# Patient Record
Sex: Female | Born: 1960
Health system: Southern US, Community
[De-identification: ages and names within clinical notes are randomized; demographics above are authoritative.]

## PROBLEM LIST (undated history)

## (undated) DIAGNOSIS — R0600 Dyspnea, unspecified: Secondary | ICD-10-CM

## (undated) DIAGNOSIS — N281 Cyst of kidney, acquired: Secondary | ICD-10-CM

## (undated) DIAGNOSIS — R748 Abnormal levels of other serum enzymes: Secondary | ICD-10-CM

## (undated) DIAGNOSIS — T4145XA Adverse effect of unspecified anesthetic, initial encounter: Secondary | ICD-10-CM

## (undated) DIAGNOSIS — Z9889 Other specified postprocedural states: Secondary | ICD-10-CM

## (undated) DIAGNOSIS — E119 Type 2 diabetes mellitus without complications: Secondary | ICD-10-CM

## (undated) DIAGNOSIS — M1990 Unspecified osteoarthritis, unspecified site: Secondary | ICD-10-CM

## (undated) DIAGNOSIS — J111 Influenza due to unidentified influenza virus with other respiratory manifestations: Secondary | ICD-10-CM

## (undated) DIAGNOSIS — E559 Vitamin D deficiency, unspecified: Secondary | ICD-10-CM

## (undated) DIAGNOSIS — T8859XA Other complications of anesthesia, initial encounter: Secondary | ICD-10-CM

## (undated) DIAGNOSIS — S4290XA Fracture of unspecified shoulder girdle, part unspecified, initial encounter for closed fracture: Secondary | ICD-10-CM

## (undated) DIAGNOSIS — R112 Nausea with vomiting, unspecified: Secondary | ICD-10-CM

## (undated) DIAGNOSIS — Z87442 Personal history of urinary calculi: Secondary | ICD-10-CM

## (undated) DIAGNOSIS — J45909 Unspecified asthma, uncomplicated: Secondary | ICD-10-CM

## (undated) DIAGNOSIS — I10 Essential (primary) hypertension: Secondary | ICD-10-CM

## (undated) DIAGNOSIS — D649 Anemia, unspecified: Secondary | ICD-10-CM

---
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
## 1982-07-09 HISTORY — PX: NASAL SEPTUM SURGERY: SHX37

## 1999-09-26 ENCOUNTER — Encounter: Payer: Self-pay | Admitting: Internal Medicine

## 1999-09-26 ENCOUNTER — Encounter: Admission: RE | Admit: 1999-09-26 | Discharge: 1999-09-26 | Payer: Self-pay | Admitting: Internal Medicine

## 1999-10-02 ENCOUNTER — Encounter: Admission: RE | Admit: 1999-10-02 | Discharge: 1999-10-02 | Payer: Self-pay | Admitting: Internal Medicine

## 1999-10-02 ENCOUNTER — Encounter: Payer: Self-pay | Admitting: Internal Medicine

## 2000-01-15 ENCOUNTER — Emergency Department (HOSPITAL_COMMUNITY): Admission: EM | Admit: 2000-01-15 | Discharge: 2000-01-15 | Payer: Self-pay | Admitting: Emergency Medicine

## 2000-01-16 ENCOUNTER — Encounter: Payer: Self-pay | Admitting: Emergency Medicine

## 2001-10-31 ENCOUNTER — Encounter: Payer: Self-pay | Admitting: Internal Medicine

## 2001-10-31 ENCOUNTER — Encounter: Admission: RE | Admit: 2001-10-31 | Discharge: 2001-10-31 | Payer: Self-pay | Admitting: Internal Medicine

## 2002-03-02 ENCOUNTER — Encounter: Admission: RE | Admit: 2002-03-02 | Discharge: 2002-03-02 | Payer: Self-pay | Admitting: Internal Medicine

## 2002-03-02 ENCOUNTER — Encounter: Payer: Self-pay | Admitting: Internal Medicine

## 2002-05-04 ENCOUNTER — Encounter: Payer: Self-pay | Admitting: Internal Medicine

## 2002-05-04 ENCOUNTER — Encounter: Admission: RE | Admit: 2002-05-04 | Discharge: 2002-05-04 | Payer: Self-pay | Admitting: Internal Medicine

## 2002-07-22 ENCOUNTER — Encounter: Admission: RE | Admit: 2002-07-22 | Discharge: 2002-07-22 | Payer: Self-pay | Admitting: Internal Medicine

## 2002-07-22 ENCOUNTER — Encounter: Payer: Self-pay | Admitting: Internal Medicine

## 2003-02-04 ENCOUNTER — Encounter: Payer: Self-pay | Admitting: Internal Medicine

## 2003-02-04 ENCOUNTER — Encounter: Admission: RE | Admit: 2003-02-04 | Discharge: 2003-02-04 | Payer: Self-pay | Admitting: Internal Medicine

## 2003-12-08 ENCOUNTER — Encounter: Admission: RE | Admit: 2003-12-08 | Discharge: 2003-12-08 | Payer: Self-pay | Admitting: Internal Medicine

## 2004-05-19 ENCOUNTER — Encounter: Admission: RE | Admit: 2004-05-19 | Discharge: 2004-05-19 | Payer: Self-pay | Admitting: Internal Medicine

## 2005-04-13 ENCOUNTER — Encounter: Admission: RE | Admit: 2005-04-13 | Discharge: 2005-04-13 | Payer: Self-pay | Admitting: Internal Medicine

## 2005-04-13 ENCOUNTER — Other Ambulatory Visit: Admission: RE | Admit: 2005-04-13 | Discharge: 2005-04-13 | Payer: Self-pay | Admitting: Internal Medicine

## 2006-04-02 ENCOUNTER — Encounter: Admission: RE | Admit: 2006-04-02 | Discharge: 2006-04-02 | Payer: Self-pay | Admitting: Internal Medicine

## 2007-06-09 ENCOUNTER — Encounter: Admission: RE | Admit: 2007-06-09 | Discharge: 2007-06-09 | Payer: Self-pay | Admitting: Internal Medicine

## 2008-02-23 ENCOUNTER — Encounter: Admission: RE | Admit: 2008-02-23 | Discharge: 2008-02-23 | Payer: Self-pay | Admitting: Internal Medicine

## 2008-05-19 ENCOUNTER — Other Ambulatory Visit: Admission: RE | Admit: 2008-05-19 | Discharge: 2008-05-19 | Payer: Self-pay | Admitting: Internal Medicine

## 2008-10-28 ENCOUNTER — Encounter: Admission: RE | Admit: 2008-10-28 | Discharge: 2008-10-28 | Payer: Self-pay | Admitting: Internal Medicine

## 2008-11-05 ENCOUNTER — Encounter: Admission: RE | Admit: 2008-11-05 | Discharge: 2008-11-05 | Payer: Self-pay | Admitting: Internal Medicine

## 2009-11-16 ENCOUNTER — Ambulatory Visit (HOSPITAL_COMMUNITY): Admission: RE | Admit: 2009-11-16 | Discharge: 2009-11-16 | Payer: Self-pay | Admitting: Orthopedic Surgery

## 2009-11-16 ENCOUNTER — Encounter (INDEPENDENT_AMBULATORY_CARE_PROVIDER_SITE_OTHER): Payer: Self-pay | Admitting: Orthopedic Surgery

## 2009-11-16 ENCOUNTER — Ambulatory Visit: Payer: Self-pay | Admitting: Vascular Surgery

## 2010-01-06 HISTORY — PX: KNEE ARTHROSCOPY: SHX127

## 2010-01-24 ENCOUNTER — Ambulatory Visit (HOSPITAL_COMMUNITY): Admission: RE | Admit: 2010-01-24 | Discharge: 2010-01-24 | Payer: Self-pay | Admitting: Orthopedic Surgery

## 2010-01-31 ENCOUNTER — Encounter: Admission: RE | Admit: 2010-01-31 | Discharge: 2010-01-31 | Payer: Self-pay | Admitting: Orthopedic Surgery

## 2010-06-30 ENCOUNTER — Encounter
Admission: RE | Admit: 2010-06-30 | Discharge: 2010-06-30 | Payer: Self-pay | Source: Home / Self Care | Attending: Internal Medicine | Admitting: Internal Medicine

## 2010-09-24 LAB — COMPREHENSIVE METABOLIC PANEL
ALT: 30 U/L (ref 0–35)
AST: 28 U/L (ref 0–37)
Albumin: 4.1 g/dL (ref 3.5–5.2)
Alkaline Phosphatase: 115 U/L (ref 39–117)
BUN: 17 mg/dL (ref 6–23)
CO2: 27 mEq/L (ref 19–32)
Calcium: 9.5 mg/dL (ref 8.4–10.5)
Chloride: 102 mEq/L (ref 96–112)
Creatinine, Ser: 0.75 mg/dL (ref 0.4–1.2)
GFR calc Af Amer: >60 mL/min (ref >60)
GFR calc non Af Amer: >60 mL/min (ref >60)
Glucose, Bld: 112 mg/dL — ABNORMAL HIGH (ref 70–99)
Potassium: 3.8 mEq/L (ref 3.5–5.1)
Sodium: 137 mEq/L (ref 135–145)
Total Bilirubin: 0.8 mg/dL (ref 0.3–1.2)
Total Protein: 7.9 g/dL (ref 6.0–8.3)

## 2010-09-24 LAB — URINALYSIS, ROUTINE W REFLEX MICROSCOPIC
Bilirubin Urine: NEGATIVE
Glucose, UA: NEGATIVE mg/dL
Hgb urine dipstick: NEGATIVE
Ketones, ur: NEGATIVE mg/dL
Nitrite: NEGATIVE
Protein, ur: NEGATIVE mg/dL
Specific Gravity, Urine: 1.019 (ref 1.005–1.030)
Urobilinogen, UA: 0.2 mg/dL (ref 0.0–1.0)
pH: 5.5 (ref 5.0–8.0)

## 2010-09-24 LAB — DIFFERENTIAL
Basophils Absolute: 0 10*3/uL (ref 0.0–0.1)
Basophils Relative: 1 % (ref 0–1)
Eosinophils Absolute: 0.2 10*3/uL (ref 0.0–0.7)
Eosinophils Relative: 3 % (ref 0–5)
Lymphocytes Relative: 35 % (ref 12–46)
Lymphs Abs: 2.6 10*3/uL (ref 0.7–4.0)
Monocytes Absolute: 0.7 10*3/uL (ref 0.1–1.0)
Monocytes Relative: 9 % (ref 3–12)
Neutro Abs: 4 10*3/uL (ref 1.7–7.7)
Neutrophils Relative %: 53 % (ref 43–77)

## 2010-09-24 LAB — CBC
HCT: 37.2 % (ref 36.0–46.0)
Hemoglobin: 12.5 g/dL (ref 12.0–15.0)
MCH: 28.9 pg (ref 26.0–34.0)
MCHC: 33.7 g/dL (ref 30.0–36.0)
MCV: 85.6 fL (ref 78.0–100.0)
Platelets: 287 10*3/uL (ref 150–400)
RBC: 4.34 MIL/uL (ref 3.87–5.11)
RDW: 14.3 % (ref 11.5–15.5)
WBC: 7.5 10*3/uL (ref 4.0–10.5)

## 2010-09-24 LAB — SURGICAL PCR SCREEN: MRSA, PCR: INVALID — AB

## 2010-09-24 LAB — PROTIME-INR
INR: 0.98 (ref 0.00–1.49)
Prothrombin Time: 12.9 seconds (ref 11.6–15.2)

## 2010-12-27 ENCOUNTER — Other Ambulatory Visit: Payer: Self-pay | Admitting: Internal Medicine

## 2010-12-27 ENCOUNTER — Other Ambulatory Visit (HOSPITAL_COMMUNITY)
Admission: RE | Admit: 2010-12-27 | Discharge: 2010-12-27 | Disposition: A | Discharge status: Home / Self Care | Payer: BC Managed Care – PPO | Source: Ambulatory Visit | Attending: Internal Medicine | Admitting: Internal Medicine

## 2010-12-27 DIAGNOSIS — Z01419 Encounter for gynecological examination (general) (routine) without abnormal findings: Secondary | ICD-10-CM | POA: Insufficient documentation

## 2010-12-27 DIAGNOSIS — Z1159 Encounter for screening for other viral diseases: Secondary | ICD-10-CM | POA: Insufficient documentation

## 2011-03-02 ENCOUNTER — Other Ambulatory Visit: Payer: Self-pay | Admitting: Otolaryngology

## 2011-03-02 DIAGNOSIS — K115 Sialolithiasis: Secondary | ICD-10-CM

## 2011-03-02 DIAGNOSIS — R599 Enlarged lymph nodes, unspecified: Secondary | ICD-10-CM

## 2011-03-06 ENCOUNTER — Ambulatory Visit
Admission: RE | Admit: 2011-03-06 | Discharge: 2011-03-06 | Disposition: A | Discharge status: Home / Self Care | Payer: BC Managed Care – PPO | Source: Ambulatory Visit | Attending: Otolaryngology | Admitting: Otolaryngology

## 2011-03-06 DIAGNOSIS — K115 Sialolithiasis: Secondary | ICD-10-CM

## 2011-03-06 DIAGNOSIS — R599 Enlarged lymph nodes, unspecified: Secondary | ICD-10-CM

## 2011-03-06 MED ORDER — IOHEXOL 300 MG/ML  SOLN
75.0000 mL | Freq: Once | INTRAMUSCULAR | Status: AC | PRN
  Administered 2011-03-06: 75 mL via INTRAVENOUS

## 2011-08-08 ENCOUNTER — Other Ambulatory Visit: Payer: Self-pay | Admitting: Internal Medicine

## 2011-08-08 DIAGNOSIS — Z1231 Encounter for screening mammogram for malignant neoplasm of breast: Secondary | ICD-10-CM

## 2011-08-09 ENCOUNTER — Ambulatory Visit
Admission: RE | Admit: 2011-08-09 | Discharge: 2011-08-09 | Disposition: A | Discharge status: Home / Self Care | Payer: BC Managed Care – PPO | Source: Ambulatory Visit | Attending: Allergy | Admitting: Allergy

## 2011-08-09 ENCOUNTER — Ambulatory Visit
Admission: RE | Admit: 2011-08-09 | Discharge: 2011-08-09 | Disposition: A | Discharge status: Home / Self Care | Payer: BC Managed Care – PPO | Source: Ambulatory Visit | Attending: Internal Medicine | Admitting: Internal Medicine

## 2011-08-09 ENCOUNTER — Other Ambulatory Visit: Payer: Self-pay | Admitting: Allergy

## 2011-08-09 DIAGNOSIS — J45909 Unspecified asthma, uncomplicated: Secondary | ICD-10-CM

## 2011-08-09 DIAGNOSIS — Z1231 Encounter for screening mammogram for malignant neoplasm of breast: Secondary | ICD-10-CM

## 2011-08-10 ENCOUNTER — Other Ambulatory Visit: Payer: Self-pay | Admitting: Internal Medicine

## 2011-08-10 DIAGNOSIS — R928 Other abnormal and inconclusive findings on diagnostic imaging of breast: Secondary | ICD-10-CM

## 2011-08-23 ENCOUNTER — Ambulatory Visit
Admission: RE | Admit: 2011-08-23 | Discharge: 2011-08-23 | Disposition: A | Discharge status: Home / Self Care | Payer: BC Managed Care – PPO | Source: Ambulatory Visit | Attending: Internal Medicine | Admitting: Internal Medicine

## 2011-08-23 ENCOUNTER — Other Ambulatory Visit: Payer: BC Managed Care – PPO

## 2011-08-23 DIAGNOSIS — R928 Other abnormal and inconclusive findings on diagnostic imaging of breast: Secondary | ICD-10-CM

## 2012-01-30 ENCOUNTER — Other Ambulatory Visit: Payer: Self-pay | Admitting: Internal Medicine

## 2012-02-05 ENCOUNTER — Ambulatory Visit
Admission: RE | Admit: 2012-02-05 | Discharge: 2012-02-05 | Disposition: A | Discharge status: Home / Self Care | Payer: BC Managed Care – PPO | Source: Ambulatory Visit | Attending: Internal Medicine | Admitting: Internal Medicine

## 2012-12-26 NOTE — Patient Instructions (Addendum)
Samantha Park  6/Samantha/2014   Your procedure is scheduled on: 12/31/12  Report to [facility] at 9:30 AM.  Call this number if you have problems the morning of surgery [phone]   Remember:   Do not eat food or drink liquids After Midnight.    Do not wear jewelry, make-up or nail polish.  Do not wear lotions, powders, or perfumes. You may wear deodorant.  Do not shave 48 hours prior to surgery. Men may shave face and neck.  Do not bring valuables to the hospital.  Contacts, dentures or bridgework may not be worn into surgery.   Patients discharged the day of surgery will not be allowed to drive home.  Name and phone number of your driver: Samantha Park (husband) [phone]     Please read over the following fact sheets that you were given: MRSA Information.  Birdie Sons, RN  pre op nurse call if needed [phone]    FAILURE TO FOLLOW THESE INSTRUCTIONS MAY RESULT IN CANCELLATION OF YOUR SURGERY   Patient Signature: ___________________________________________

## 2012-12-29 ENCOUNTER — Encounter (HOSPITAL_COMMUNITY)
Admission: RE | Admit: 2012-12-29 | Discharge: 2012-12-29 | Disposition: A | Discharge status: Home / Self Care | Payer: BC Managed Care – PPO | Source: Ambulatory Visit | Attending: Orthopedic Surgery | Admitting: Orthopedic Surgery

## 2012-12-29 ENCOUNTER — Encounter (HOSPITAL_COMMUNITY): Payer: Self-pay | Admitting: Pharmacy Technician

## 2012-12-29 ENCOUNTER — Ambulatory Visit (HOSPITAL_COMMUNITY)
Admission: RE | Admit: 2012-12-29 | Discharge: 2012-12-29 | Disposition: A | Discharge status: Home / Self Care | Payer: BC Managed Care – PPO | Source: Ambulatory Visit | Attending: Orthopedic Surgery | Admitting: Orthopedic Surgery

## 2012-12-29 ENCOUNTER — Encounter (HOSPITAL_COMMUNITY): Payer: Self-pay

## 2012-12-29 DIAGNOSIS — Z01818 Encounter for other preprocedural examination: Secondary | ICD-10-CM | POA: Insufficient documentation

## 2012-12-29 DIAGNOSIS — Z01810 Encounter for preprocedural cardiovascular examination: Secondary | ICD-10-CM | POA: Insufficient documentation

## 2012-12-29 HISTORY — DX: Unspecified asthma, uncomplicated: J45.909

## 2012-12-29 HISTORY — DX: Anemia, unspecified: D64.9

## 2012-12-29 HISTORY — DX: Vitamin D deficiency, unspecified: E55.9

## 2012-12-29 HISTORY — DX: Essential (primary) hypertension: I10

## 2012-12-29 HISTORY — DX: Unspecified osteoarthritis, unspecified site: M19.90

## 2012-12-29 LAB — CBC
HCT: 35.7 % — ABNORMAL LOW (ref 36.0–46.0)
Hemoglobin: 11.7 g/dL — ABNORMAL LOW (ref 12.0–15.0)
MCH: 28.3 pg (ref 26.0–34.0)
MCV: 86.2 fL (ref 78.0–100.0)
RBC: 4.14 MIL/uL (ref 3.87–5.11)

## 2012-12-29 LAB — BASIC METABOLIC PANEL
CO2: 29 mEq/L (ref 19–32)
Calcium: 9.5 mg/dL (ref 8.4–10.5)
Creatinine, Ser: 0.77 mg/dL (ref 0.50–1.10)
Glucose, Bld: 97 mg/dL (ref 70–99)

## 2012-12-29 LAB — SURGICAL PCR SCREEN: Staphylococcus aureus: NEGATIVE

## 2012-12-29 NOTE — Progress Notes (Signed)
12/29/12 0921  OBSTRUCTIVE SLEEP APNEA  Have you ever been diagnosed with sleep apnea through a sleep study? No  Do you snore loudly (loud enough to be heard through closed doors)?  1  Do you often feel tired, fatigued, or sleepy during the daytime? 0  Has anyone observed you stop breathing during your sleep? 0  Do you have, or are you being treated for high blood pressure? 1  BMI more than 35 kg/m2? 1  Age over 52 years old? 1  Neck circumference greater than 40 cm/18 inches? 0  Gender: 0  Obstructive Sleep Apnea Score 4  Score 4 or greater  Results sent to PCP

## 2012-12-31 ENCOUNTER — Encounter (HOSPITAL_COMMUNITY): Payer: Self-pay | Admitting: *Deleted

## 2012-12-31 ENCOUNTER — Ambulatory Visit (HOSPITAL_COMMUNITY): Payer: BC Managed Care – PPO | Admitting: Anesthesiology

## 2012-12-31 ENCOUNTER — Encounter (HOSPITAL_COMMUNITY)
Admission: RE | Disposition: A | Discharge status: Home / Self Care | Payer: Self-pay | Source: Ambulatory Visit | Attending: Orthopedic Surgery

## 2012-12-31 ENCOUNTER — Encounter (HOSPITAL_COMMUNITY): Payer: Self-pay | Admitting: Anesthesiology

## 2012-12-31 ENCOUNTER — Ambulatory Visit (HOSPITAL_COMMUNITY)
Admission: RE | Admit: 2012-12-31 | Discharge: 2012-12-31 | Disposition: A | Discharge status: Home / Self Care | Payer: BC Managed Care – PPO | Source: Ambulatory Visit | Attending: Orthopedic Surgery | Admitting: Orthopedic Surgery

## 2012-12-31 DIAGNOSIS — M6580 Other synovitis and tenosynovitis, unspecified site: Secondary | ICD-10-CM | POA: Insufficient documentation

## 2012-12-31 DIAGNOSIS — Z79899 Other long term (current) drug therapy: Secondary | ICD-10-CM | POA: Insufficient documentation

## 2012-12-31 DIAGNOSIS — X58XXXA Exposure to other specified factors, initial encounter: Secondary | ICD-10-CM | POA: Insufficient documentation

## 2012-12-31 DIAGNOSIS — M9420 Chondromalacia, unspecified site: Secondary | ICD-10-CM | POA: Insufficient documentation

## 2012-12-31 DIAGNOSIS — IMO0002 Reserved for concepts with insufficient information to code with codable children: Secondary | ICD-10-CM | POA: Insufficient documentation

## 2012-12-31 DIAGNOSIS — M1710 Unilateral primary osteoarthritis, unspecified knee: Secondary | ICD-10-CM | POA: Insufficient documentation

## 2012-12-31 DIAGNOSIS — M23321 Other meniscus derangements, posterior horn of medial meniscus, right knee: Secondary | ICD-10-CM

## 2012-12-31 DIAGNOSIS — M23329 Other meniscus derangements, posterior horn of medial meniscus, unspecified knee: Secondary | ICD-10-CM | POA: Diagnosis present

## 2012-12-31 DIAGNOSIS — J45909 Unspecified asthma, uncomplicated: Secondary | ICD-10-CM | POA: Insufficient documentation

## 2012-12-31 DIAGNOSIS — I10 Essential (primary) hypertension: Secondary | ICD-10-CM | POA: Insufficient documentation

## 2012-12-31 HISTORY — PX: KNEE ARTHROSCOPY WITH MEDIAL MENISECTOMY: SHX5651

## 2012-12-31 SURGERY — ARTHROSCOPY, KNEE, WITH MEDIAL MENISCECTOMY
Anesthesia: General | Site: Knee | Laterality: Right | Wound class: Clean

## 2012-12-31 MED ORDER — HYDROMORPHONE HCL 2 MG PO TABS: 2.0000 mg | ORAL_TABLET | ORAL | Status: DC | PRN

## 2012-12-31 MED ORDER — HYDROMORPHONE HCL PF 1 MG/ML IJ SOLN
INTRAMUSCULAR | Status: AC
  Filled 2012-12-31: qty 1

## 2012-12-31 MED ORDER — ONDANSETRON HCL 4 MG/2ML IJ SOLN
INTRAMUSCULAR | Status: DC | PRN
  Administered 2012-12-31: 4 mg via INTRAVENOUS

## 2012-12-31 MED ORDER — LACTATED RINGERS IR SOLN
Status: DC | PRN
  Administered 2012-12-31: 6000 mL

## 2012-12-31 MED ORDER — BUPIVACAINE-EPINEPHRINE PF 0.25-1:200000 % IJ SOLN
INTRAMUSCULAR | Status: AC
  Filled 2012-12-31: qty 30

## 2012-12-31 MED ORDER — BUPIVACAINE-EPINEPHRINE 0.25% -1:200000 IJ SOLN
INTRAMUSCULAR | Status: DC | PRN
  Administered 2012-12-31: 30 mL

## 2012-12-31 MED ORDER — LACTATED RINGERS IV SOLN
INTRAVENOUS | Status: DC
  Administered 2012-12-31: 1000 mL via INTRAVENOUS

## 2012-12-31 MED ORDER — PROPOFOL 10 MG/ML IV BOLUS
INTRAVENOUS | Status: DC | PRN
  Administered 2012-12-31: 180 mg via INTRAVENOUS

## 2012-12-31 MED ORDER — HYDROMORPHONE HCL PF 1 MG/ML IJ SOLN
0.2500 mg | INTRAMUSCULAR | Status: DC | PRN
  Administered 2012-12-31 (×2): 0.5 mg via INTRAVENOUS

## 2012-12-31 MED ORDER — MIDAZOLAM HCL 2 MG/2ML IJ SOLN
INTRAMUSCULAR | Status: AC
  Administered 2012-12-31: 1 mg
  Filled 2012-12-31: qty 2

## 2012-12-31 MED ORDER — PROMETHAZINE HCL 25 MG/ML IJ SOLN
INTRAMUSCULAR | Status: AC
  Filled 2012-12-31: qty 1

## 2012-12-31 MED ORDER — BACITRACIN ZINC 500 UNIT/GM EX OINT
TOPICAL_OINTMENT | CUTANEOUS | Status: DC | PRN
  Administered 2012-12-31: 1 via TOPICAL

## 2012-12-31 MED ORDER — FENTANYL CITRATE 0.05 MG/ML IJ SOLN
INTRAMUSCULAR | Status: DC | PRN
  Administered 2012-12-31 (×3): 25 ug via INTRAVENOUS

## 2012-12-31 MED ORDER — PROMETHAZINE HCL 25 MG/ML IJ SOLN
6.2500 mg | INTRAMUSCULAR | Status: DC | PRN
  Administered 2012-12-31: 6.25 mg via INTRAVENOUS

## 2012-12-31 MED ORDER — MIDAZOLAM HCL 5 MG/5ML IJ SOLN
INTRAMUSCULAR | Status: DC | PRN
  Administered 2012-12-31: 2 mg via INTRAVENOUS

## 2012-12-31 MED ORDER — CEFAZOLIN SODIUM-DEXTROSE 2-3 GM-% IV SOLR
2.0000 g | INTRAVENOUS | Status: AC
  Administered 2012-12-31: 2 g via INTRAVENOUS

## 2012-12-31 MED ORDER — HYDROMORPHONE HCL PF 1 MG/ML IJ SOLN
INTRAMUSCULAR | Status: DC | PRN
  Administered 2012-12-31 (×2): 1 mg via INTRAVENOUS

## 2012-12-31 MED ORDER — BACITRACIN ZINC 500 UNIT/GM EX OINT
TOPICAL_OINTMENT | CUTANEOUS | Status: AC
  Filled 2012-12-31: qty 15

## 2012-12-31 MED ORDER — CEFAZOLIN SODIUM-DEXTROSE 2-3 GM-% IV SOLR
INTRAVENOUS | Status: AC
  Filled 2012-12-31: qty 50

## 2012-12-31 MED ORDER — LIDOCAINE HCL (CARDIAC) 20 MG/ML IV SOLN
INTRAVENOUS | Status: DC | PRN
  Administered 2012-12-31: 50 mg via INTRAVENOUS

## 2012-12-31 SURGICAL SUPPLY — 30 items
BANDAGE ELASTIC 4 VELCRO ST LF (GAUZE/BANDAGES/DRESSINGS) ×2 IMPLANT
BANDAGE ELASTIC 6 VELCRO ST LF (GAUZE/BANDAGES/DRESSINGS) ×2 IMPLANT
BANDAGE GAUZE ELAST BULKY 4 IN (GAUZE/BANDAGES/DRESSINGS) ×2 IMPLANT
BLADE GREAT WHITE 4.2 (BLADE) ×2 IMPLANT
BNDG COHESIVE 6X5 TAN STRL LF (GAUZE/BANDAGES/DRESSINGS) ×2 IMPLANT
CLOTH BEACON ORANGE TIMEOUT ST (SAFETY) ×2 IMPLANT
DRAPE LG THREE QUARTER DISP (DRAPES) ×2 IMPLANT
DRSG EMULSION OIL 3X3 NADH (GAUZE/BANDAGES/DRESSINGS) ×2 IMPLANT
DRSG PAD ABDOMINAL 8X10 ST (GAUZE/BANDAGES/DRESSINGS) ×2 IMPLANT
DURAPREP 26ML APPLICATOR (WOUND CARE) ×2 IMPLANT
GLOVE BIOGEL PI IND STRL 6.5 (GLOVE) ×1 IMPLANT
GLOVE BIOGEL PI IND STRL 8 (GLOVE) ×1 IMPLANT
GLOVE BIOGEL PI IND STRL 8.5 (GLOVE) ×1 IMPLANT
GLOVE BIOGEL PI INDICATOR 6.5 (GLOVE) ×1
GLOVE BIOGEL PI INDICATOR 8 (GLOVE) ×1
GLOVE BIOGEL PI INDICATOR 8.5 (GLOVE) ×1
GLOVE ECLIPSE 8.0 STRL XLNG CF (GLOVE) ×2 IMPLANT
GOWN STRL REIN XL XLG (GOWN DISPOSABLE) ×4 IMPLANT
MANIFOLD NEPTUNE II (INSTRUMENTS) ×2 IMPLANT
PACK ARTHROSCOPY WL (CUSTOM PROCEDURE TRAY) ×2 IMPLANT
PACK ICE MAXI GEL EZY WRAP (MISCELLANEOUS) ×2 IMPLANT
PAD MASON LEG HOLDER (PIN) ×2 IMPLANT
PADDING CAST COTTON 6X4 STRL (CAST SUPPLIES) ×2 IMPLANT
SET ARTHROSCOPY TUBING (MISCELLANEOUS) ×1
SET ARTHROSCOPY TUBING LN (MISCELLANEOUS) ×1 IMPLANT
SUT ETHILON 3 0 PS 1 (SUTURE) ×2 IMPLANT
TOWEL OR 17X26 10 PK STRL BLUE (TOWEL DISPOSABLE) ×2 IMPLANT
TUBING CONNECTING 10 (TUBING) ×2 IMPLANT
WAND 90 DEG TURBOVAC W/CORD (SURGICAL WAND) ×2 IMPLANT
WRAP KNEE MAXI GEL POST OP (GAUZE/BANDAGES/DRESSINGS) ×2 IMPLANT

## 2012-12-31 NOTE — H&P (Signed)
Samantha Park is an 52 y.o. female.   Chief Complaint: right knee pain HPI: Samantha Park is a 52 year old female who presented to Dr. Jeannetta Ellis office with right knee pain. She has had trouble with the knee off and on since November of 2013. When she came to the office in February with mechanical symptoms of locking and catching. She did not improve with conservation treatments of ice, anti-inflammatories, and rest. MRI was ordered which showed a medial meniscus tear in the right knee as well as an interosseous ganglion in the right tibia.   Past Medical History  Diagnosis Date  . Hypertension   . Asthma   . Arthritis   . Anemia   . Vitamin D deficiency     hx of  . Postural low back pain     when laying flat, needs pillow under knees    Past Surgical History  Procedure Laterality Date  . Knee arthroscopy Left july 2011  . Nasal septum surgery  1984  . Toenail excision Left 1981    big toe    Social History:  reports that she has never smoked. She has never used smokeless tobacco. She reports that she does not drink alcohol or use illicit drugs.  Allergies:  Allergies  Allergen Reactions  . Codeine Other (See Comments)    Severe rash instantly - mainly Tylenol #3  . Erythromycin Nausea Only  . Tramadol Other (See Comments)    "Looked and acted like I was in a drunk stupor after taking it"  . Meloxicam Itching and Rash     Current outpatient prescriptions budesonide-formoterol (SYMBICORT) 160-4.5 MCG/ACT inhaler, Inhale 2 puffs into the lungs 2 (two) times daily., Disp: , Rfl: ;  ibuprofen (ADVIL,MOTRIN) 200 MG tablet, Take 600 mg by mouth every 6 (six) hours as needed (For knee pain.)., Disp: , Rfl: ;  lisinopril-hydrochlorothiazide (PRINZIDE,ZESTORETIC) 10-12.5 MG per tablet, Take 1 tablet by mouth every morning., Disp: , Rfl:  montelukast (SINGULAIR) 10 MG tablet, Take 10 mg by mouth every morning., Disp: , Rfl:   Results for orders placed during the hospital encounter of  12/29/12 (from the past 48 hour(s))  SURGICAL PCR SCREEN     Status: None   Collection Time    12/29/12  9:45 AM      Result Value Range   MRSA, PCR NEGATIVE  NEGATIVE   Staphylococcus aureus NEGATIVE  NEGATIVE   Comment:            The Xpert SA Assay (FDA     approved for NASAL specimens     in patients over 34 years of age),     is one component of     a comprehensive surveillance     program.  Test performance has     been validated by [employer] for patients greater     than or equal to 10 year old.     It is not intended     to diagnose infection nor to     guide or monitor treatment.  CBC     Status: Abnormal   Collection Time    12/29/12 11:15 AM      Result Value Range   WBC 6.2  4.0 - 10.5 K/uL   RBC 4.14  3.87 - 5.11 MIL/uL   Hemoglobin 11.7 (*) 12.0 - 15.0 g/dL   HCT 16.1 (*) 09.6 - 04.5 %   MCV 86.2  78.0 - 100.0 fL  MCH 28.3  26.0 - 34.0 pg   MCHC 32.8  30.0 - 36.0 g/dL   RDW 16.1  09.6 - 04.5 %   Platelets 281  150 - 400 K/uL  BASIC METABOLIC PANEL     Status: None   Collection Time    12/29/12 11:15 AM      Result Value Range   Sodium 142  135 - 145 mEq/L   Potassium 3.9  3.5 - 5.1 mEq/L   Chloride 106  96 - 112 mEq/L   CO2 29  19 - 32 mEq/L   Glucose, Bld 97  70 - 99 mg/dL   BUN 18  6 - 23 mg/dL   Creatinine, Ser 4.09  0.50 - 1.10 mg/dL   Calcium 9.5  8.4 - 81.1 mg/dL   GFR calc non Af Amer >90  >90 mL/min   GFR calc Af Amer >90  >90 mL/min   Comment:            The eGFR has been calculated     using the CKD EPI equation.     This calculation has not been     validated in all clinical     situations.     eGFR's persistently     <90 mL/min signify     possible Chronic Kidney Disease.   Dg Chest 2 View  12/29/2012   *RADIOLOGY REPORT*  Clinical Data: Preop for knee surgery.  CHEST - 2 VIEW  Comparison: 08/09/2011.  Findings: The cardiac silhouette, mediastinal and hilar contours are normal and stable.  The lungs are clear.  No pleural  effusion. The bony thorax is intact.  IMPRESSION: No acute cardiopulmonary findings.  No change since prior study.   Original Report Authenticated By: Rudie Meyer, M.D.    Review of Systems  Constitutional: Negative.   HENT: Negative.  Negative for neck pain.   Eyes: Negative.   Respiratory: Negative.   Cardiovascular: Negative.   Musculoskeletal: Positive for joint pain and falls. Negative for myalgias and back pain.       Right knee pain  Skin: Negative.   Neurological: Negative.   Endo/Heme/Allergies: Negative.   Psychiatric/Behavioral: Negative.     Physical Exam  Constitutional: She is oriented to person, place, and time. She appears well-developed and well-nourished. No distress.  HENT:  Head: Normocephalic and atraumatic.  Right Ear: External ear normal.  Left Ear: External ear normal.  Nose: Nose normal.  Mouth/Throat: Oropharynx is clear and moist.  Eyes: Conjunctivae and EOM are normal.  Neck: Normal range of motion. Neck supple. No tracheal deviation present. No thyromegaly present.  Cardiovascular: Normal rate, regular rhythm, normal heart sounds and intact distal pulses.   No murmur heard. Respiratory: Effort normal and breath sounds normal. No respiratory distress. She has no wheezes. She exhibits no tenderness.  GI: Soft. Bowel sounds are normal. She exhibits no distension and no mass. There is no tenderness.  Musculoskeletal:       Right hip: Normal.       Left hip: Normal.       Right knee: She exhibits decreased range of motion, swelling, effusion and abnormal meniscus. She exhibits no erythema. Tenderness found. Medial joint line and lateral joint line tenderness noted.       Left knee: Normal.       Right lower leg: She exhibits no tenderness and no swelling.       Left lower leg: She exhibits no tenderness and no swelling.  Lymphadenopathy:  She has no cervical adenopathy.  Neurological: She is alert and oriented to person, place, and time. She has  normal strength and normal reflexes. No sensory deficit.  Skin: No rash noted. She is not diaphoretic. No erythema.  Psychiatric: She has a normal mood and affect. Her behavior is normal.     Assessment/Plan Right knee, medial meniscus tear She needs a right knee arthroscopy with medial menisectomy. Risks and benefits of the procedure discussed with the patient by Dr. Darrelyn Hillock.   Icy Fuhrmann LAUREN 12/31/2012, 8:24 AM

## 2012-12-31 NOTE — Anesthesia Preprocedure Evaluation (Signed)
Anesthesia Evaluation  Patient identified by MRN, date of birth, ID band Patient awake    Reviewed: Allergy & Precautions, H&P , NPO status , Patient's Chart, lab work & pertinent test results  Airway Mallampati: II TM Distance: >3 FB Neck ROM: Full    Dental  (+) Teeth Intact and Dental Advisory Given   Pulmonary neg pulmonary ROS, asthma ,  breath sounds clear to auscultation  Pulmonary exam normal       Cardiovascular hypertension, negative cardio ROS  Rhythm:Regular     Neuro/Psych negative neurological ROS  negative psych ROS   GI/Hepatic negative GI ROS, Neg liver ROS,   Endo/Other  negative endocrine ROSMorbid obesity  Renal/GU negative Renal ROS  negative genitourinary   Musculoskeletal negative musculoskeletal ROS (+)   Abdominal   Peds negative pediatric ROS (+)  Hematology negative hematology ROS (+)   Anesthesia Other Findings   Reproductive/Obstetrics negative OB ROS                           Anesthesia Physical Anesthesia Plan  ASA: II  Anesthesia Plan: General   Post-op Pain Management:    Induction: Intravenous  Airway Management Planned: LMA  Additional Equipment:   Intra-op Plan:   Post-operative Plan: Extubation in OR  Informed Consent: I have reviewed the patients History and Physical, chart, labs and discussed the procedure including the risks, benefits and alternatives for the proposed anesthesia with the patient or authorized representative who has indicated his/her understanding and acceptance.   Dental advisory given  Plan Discussed with: CRNA  Anesthesia Plan Comments:         Anesthesia Quick Evaluation

## 2012-12-31 NOTE — Interval H&P Note (Signed)
History and Physical Interval Note:  12/31/2012 11:35 AM  Samantha Park  has presented today for surgery, with the diagnosis of medial meniscus right knee  The various methods of treatment have been discussed with the patient and family. After consideration of risks, benefits and other options for treatment, the patient has consented to  Procedure(s): RIGHT KNEE ARTHROSCOPY WITH MEDIAL MENISECTOMY (Right) as a surgical intervention .  The patient's history has been reviewed, patient examined, no change in status, stable for surgery.  I have reviewed the patient's chart and labs.  Questions were answered to the patient's satisfaction.     Darrel Gloss A

## 2012-12-31 NOTE — Brief Op Note (Signed)
12/31/2012  1:18 PM  PATIENT:  Samantha Park  52 y.o. female  PRE-OPERATIVE DIAGNOSIS:  medial meniscus tear right knee and Osteoarthritis  POST-OPERATIVE DIAGNOSIS:  medial meniscus tear right knee and Osteoarthritis and severe Synovitis.  PROCEDURE:  Procedure(s): RIGHT KNEE ARTHROSCOPY WITH MEDIAL MENISECTOMY (Right) and Synovectomy  SURGEON:  Surgeon(s) and Role:    * Jacki Cones, MD - Primary    ASSISTANT: OR Tech  ANESTHESIA:   general  EBL:  Total I/O In: 400 [I.V.:400] Out: -   BLOOD ADMINISTERED:none  DRAINS: none   LOCAL MEDICATIONS USED:  MARCAINE 30cc of 0.25% with Epinephrine.    SPECIMEN:  No Specimen  DISPOSITION OF SPECIMEN:  N/A  COUNTS:  YES  TOURNIQUET:  * No tourniquets in log *  DICTATION: .Other Dictation: Dictation Number [id number]  PLAN OF CARE: Discharge to home after PACU  PATIENT DISPOSITION:  PACU - hemodynamically stable.   Delay start of Pharmacological VTE agent (>24hrs) due to surgical blood loss or risk of bleeding: yes

## 2012-12-31 NOTE — Anesthesia Postprocedure Evaluation (Signed)
Anesthesia Post Note  Patient: Samantha Park  Procedure(s) Performed: Procedure(s) (LRB): RIGHT KNEE ARTHROSCOPY WITH MEDIAL MENISECTOMY (Right)  Anesthesia type: General  Patient location: PACU  Post pain: Pain level controlled  Post assessment: Post-op Vital signs reviewed  Last Vitals:  Filed Vitals:   12/31/12 1345  BP: 128/66  Pulse: 71  Temp:   Resp: 15    Post vital signs: Reviewed  Level of consciousness: sedated  Complications: No apparent anesthesia complications

## 2012-12-31 NOTE — Progress Notes (Signed)
Pt very anxious, crying; pain right knee; Dr. Rica Mast, anesthes notified, order rec'd and med given

## 2012-12-31 NOTE — Transfer of Care (Signed)
Immediate Anesthesia Transfer of Care Note  Patient: Samantha Park  Procedure(s) Performed: Procedure(s) (LRB): RIGHT KNEE ARTHROSCOPY WITH MEDIAL MENISECTOMY (Right)  Patient Location: PACU  Anesthesia Type: General  Level of Consciousness: sedated, patient cooperative and responds to stimulaton  Airway & Oxygen Therapy: Patient Spontanous Breathing and Patient connected to face mask oxgen  Post-op Assessment: Report given to PACU RN and Post -op Vital signs reviewed and stable  Post vital signs: Reviewed and stable  Complications: No apparent anesthesia complications

## 2012-12-31 NOTE — Progress Notes (Signed)
Patient crying after going to [place]. State knee hurts does not want any medication that will make her nauseated. Ask if she can take ibuprofen and instructed her that would be fine.  Reviewed all instructions with husband due to patient being so sleepy. Remained nauseated no emesis. But did not drink but only about 60 cc.

## 2013-01-01 ENCOUNTER — Encounter (HOSPITAL_COMMUNITY): Payer: Self-pay | Admitting: Orthopedic Surgery

## 2013-01-01 NOTE — Op Note (Signed)
NAMEBETH, GOODLIN                 ACCOUNT NO.:  [id number]  MEDICAL RECORD NO.:  [id number]  LOCATION:  [facility]                         FACILITY:  [facility]  PHYSICIAN:  Georges Lynch. Nur Krasinski, M.D.DATE OF BIRTH:  [birth year]  DATE OF PROCEDURE:  12/31/2012 DATE OF DISCHARGE:  12/31/2012                              OPERATIVE REPORT   SURGEON:  Windy Fast A. Taryne Kiger, M.D.  ASSISTANT:  The OR tech.  PREOPERATIVE DIAGNOSIS: 1. Torn medial meniscus of the right knee. 2. Extensive synovitis, right knee. 3. Early arthritis medial compartment, and patellofemoral joint, right     knee.  POSTOPERATIVE DIAGNOSIS: 1. Torn medial meniscus of the right knee. 2. Extensive synovitis, right knee. 3. Early arthritis medial compartment, and patellofemoral joint, right     knee.  OPERATION: 1. Diagnostic arthroscopy, right knee. 2. Partial medial meniscectomy, right knee. 3. Extensive synovectomy suprapatellar pouch, right knee.  PROCEDURE IN DETAIL:  Under general anesthesia, routine orthopedic prep and draping the right lower extremity was carried out with the right leg in a knee holder.  The appropriate time-out was first carried out.  I also marked the appropriate right leg in the holding area.  At this time, the patient had 2 g of IV Ancef.  At this time, a small punctate incision was made in the suprapatellar pouch, inflow cannula was inserted, and knee was distended with saline.  Another small punctate incision was made in the anterolateral joint.  The arthroscope was entered from lateral approach and a complete diagnostic arthroscopy was carried out.  Upgoing up into the suprapatellar pouch, she had extensive chronic synovitis, I introduced the ArthroCare and did a synovectomy.  I examined the patellofemoral joint.  There was minimal chondromalacia. Going down to the lateral joint lateral meniscus was intact.  Cruciates were intact.  I went over the medial joint, and she had a purple type tear of  the medial meniscus.  I simply did a partial medial meniscectomy, the main part of the meniscus was intact.  She had some mild synovitis medially as well and I utilized the [employer] and did a partial synovectomy.  I thoroughly irrigated out the knee, reinspected the knee, there were no other abnormalities noted.  After irrigating the knee and removed all the fluid, closed all 3 punctate incisions with 3-0 nylon suture.  I injected 30 mL of 0.25% Marcaine with epinephrine in the knee joint.  Sterile Neosporin dressing was applied.          ______________________________ Georges Lynch Darrelyn Hillock, M.D.     RAG/MEDQ  D:  12/31/2012  T:  01/01/2013  Job:  [id number]

## 2013-08-03 ENCOUNTER — Other Ambulatory Visit: Payer: Self-pay | Admitting: Nurse Practitioner

## 2013-08-03 DIAGNOSIS — R109 Unspecified abdominal pain: Secondary | ICD-10-CM

## 2013-08-04 ENCOUNTER — Ambulatory Visit
Admission: RE | Admit: 2013-08-04 | Discharge: 2013-08-04 | Disposition: A | Discharge status: Home / Self Care | Payer: BC Managed Care – PPO | Source: Ambulatory Visit | Attending: Nurse Practitioner | Admitting: Nurse Practitioner

## 2013-08-04 DIAGNOSIS — R109 Unspecified abdominal pain: Secondary | ICD-10-CM

## 2014-01-29 ENCOUNTER — Other Ambulatory Visit: Payer: Self-pay | Admitting: Family Medicine

## 2014-01-29 ENCOUNTER — Other Ambulatory Visit (HOSPITAL_COMMUNITY)
Admission: RE | Admit: 2014-01-29 | Discharge: 2014-01-29 | Disposition: A | Discharge status: Home / Self Care | Payer: BC Managed Care – PPO | Source: Ambulatory Visit | Attending: Family Medicine | Admitting: Family Medicine

## 2014-01-29 DIAGNOSIS — Z1151 Encounter for screening for human papillomavirus (HPV): Secondary | ICD-10-CM | POA: Insufficient documentation

## 2014-01-29 DIAGNOSIS — Z0000 Encounter for general adult medical examination without abnormal findings: Secondary | ICD-10-CM | POA: Insufficient documentation

## 2014-02-01 ENCOUNTER — Other Ambulatory Visit: Payer: Self-pay

## 2014-02-01 ENCOUNTER — Ambulatory Visit
Admission: RE | Admit: 2014-02-01 | Discharge: 2014-02-01 | Disposition: A | Discharge status: Home / Self Care | Payer: BC Managed Care – PPO | Source: Ambulatory Visit

## 2014-02-01 DIAGNOSIS — Z1231 Encounter for screening mammogram for malignant neoplasm of breast: Secondary | ICD-10-CM

## 2014-02-03 LAB — CYTOLOGY - PAP

## 2014-06-09 IMAGING — CR DG CHEST 2V
2 series · 2 of 2 positions shown · non-contrast
Comparison: 08/09/2011.

CLINICAL DATA: Preop for knee surgery.

CHEST - 2 VIEW

[w chest pa]
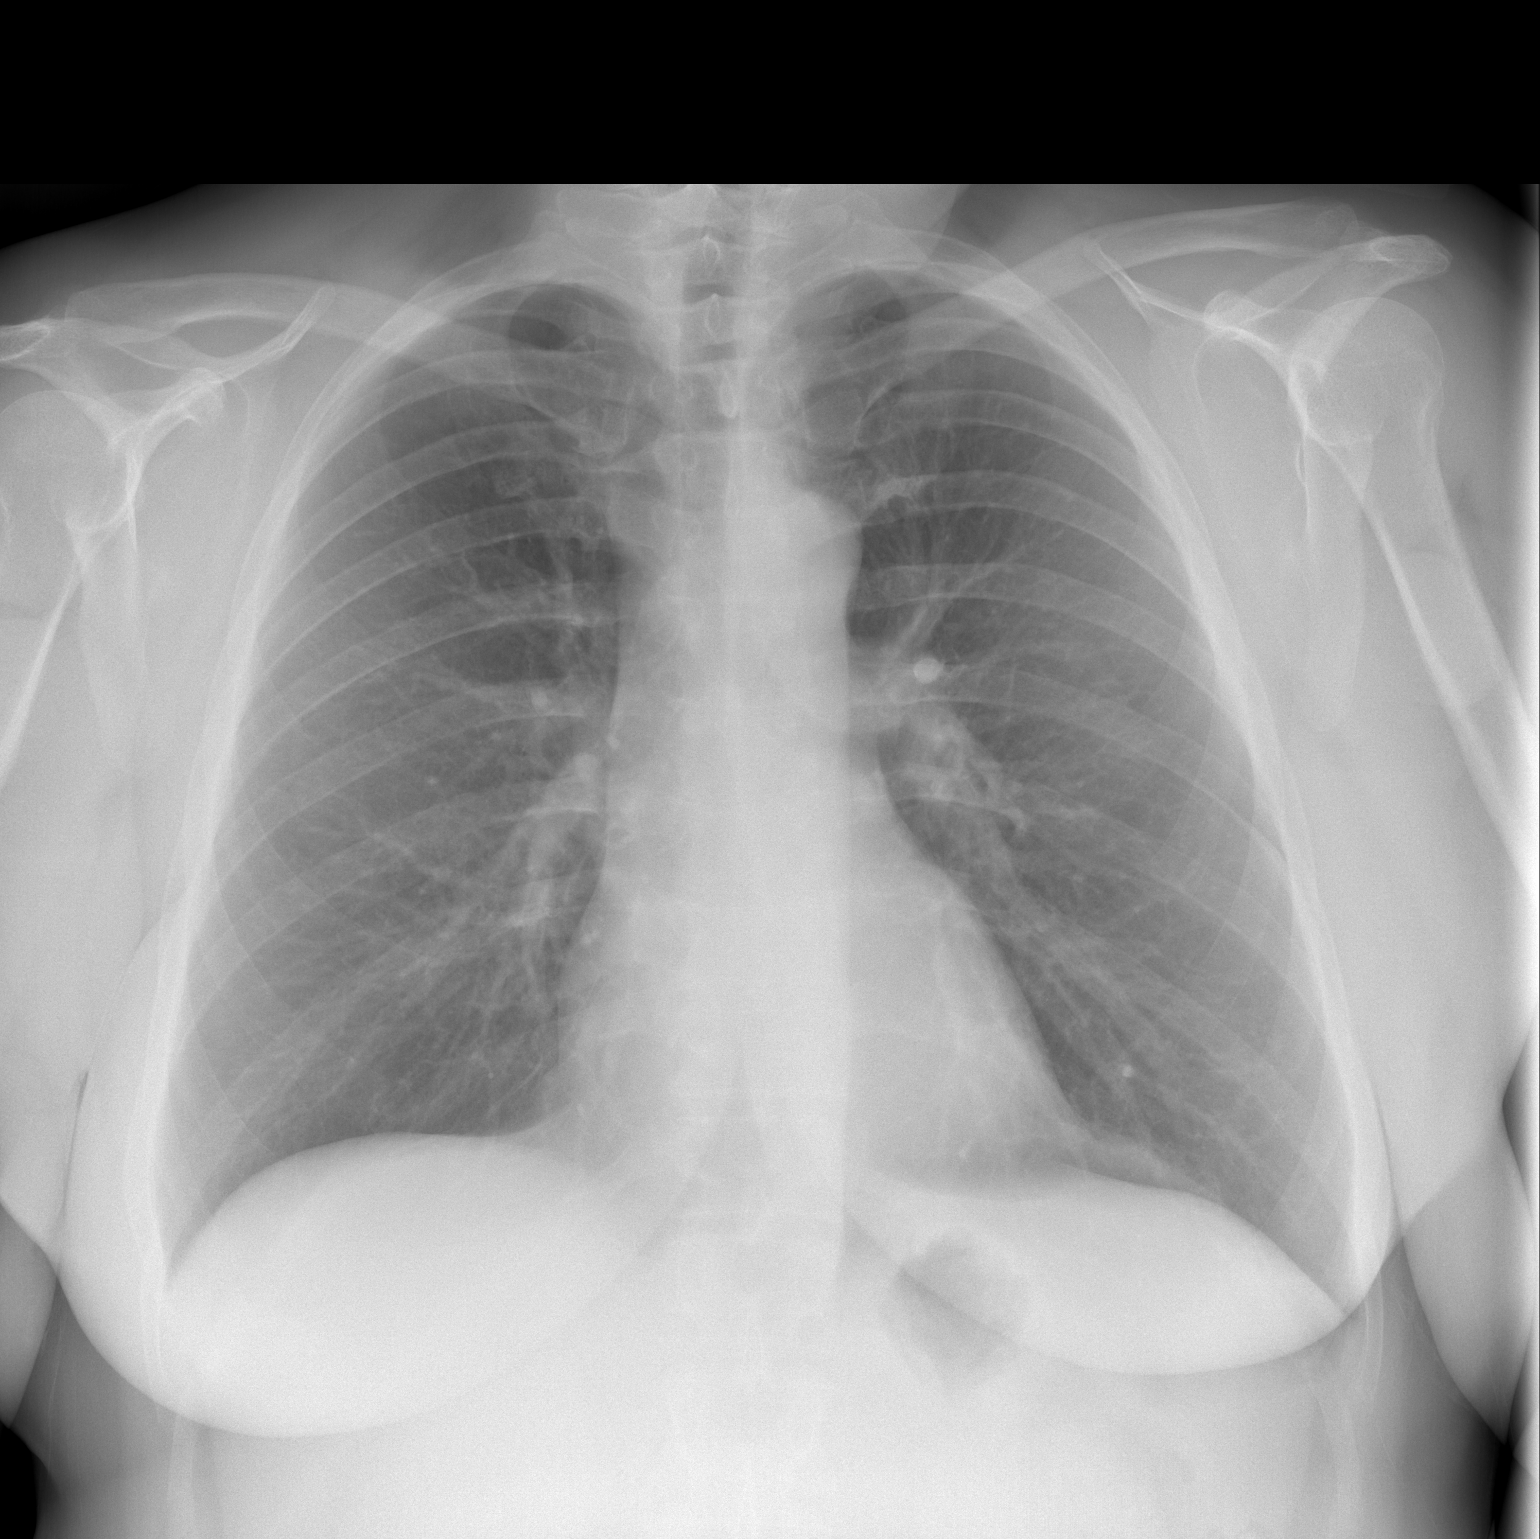

[w chest lat]
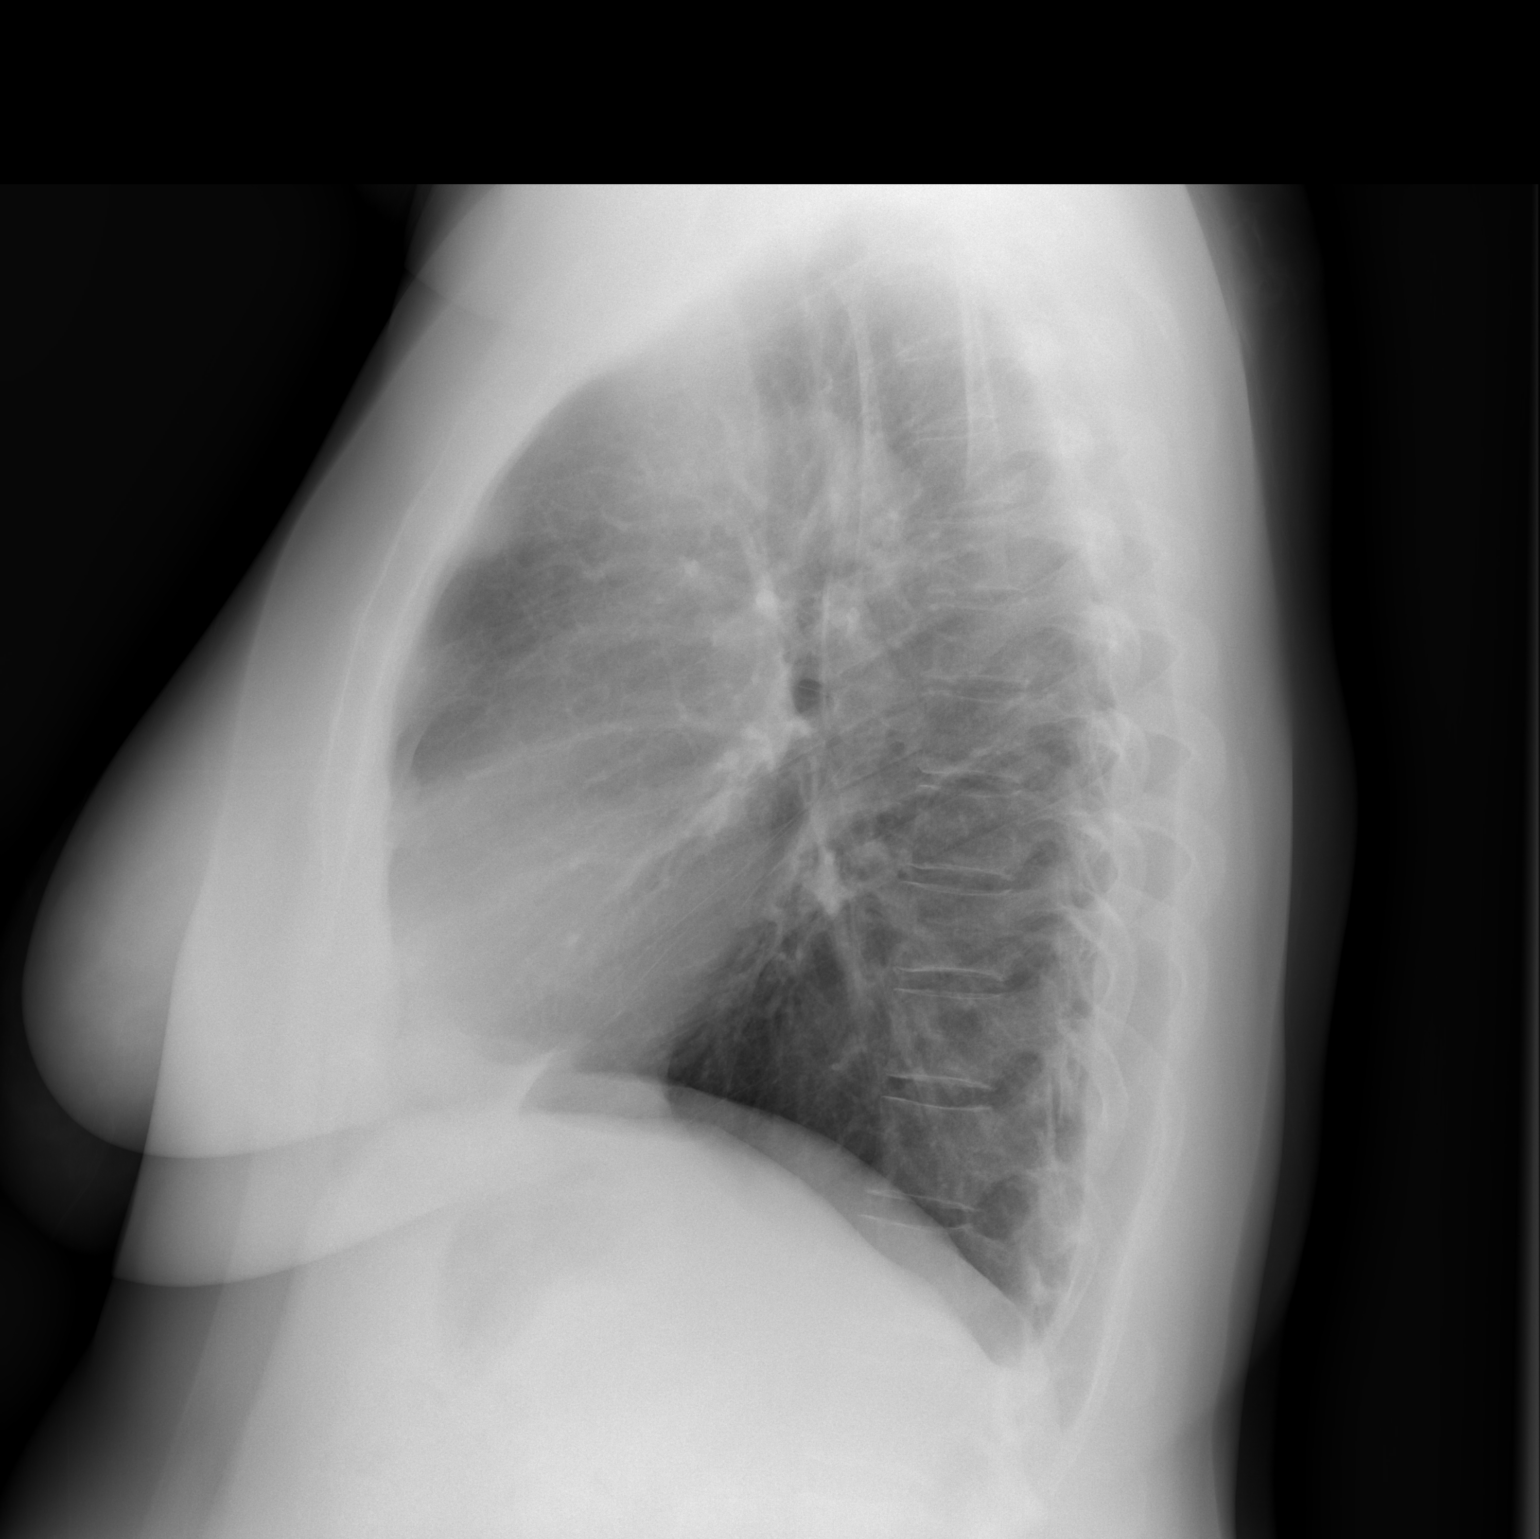

[2 of 2 positions shown; findings below may reference images not displayed]

FINDINGS: The cardiac silhouette, mediastinal and hilar contours
are normal and stable.  The lungs are clear.  No pleural effusion.
The bony thorax is intact.
IMPRESSION: No acute cardiopulmonary findings.  No change since prior study.

## 2014-07-09 HISTORY — PX: BACK SURGERY: SHX140

## 2015-06-23 ENCOUNTER — Ambulatory Visit (INDEPENDENT_AMBULATORY_CARE_PROVIDER_SITE_OTHER): Payer: BLUE CROSS/BLUE SHIELD | Admitting: Podiatry

## 2015-06-23 ENCOUNTER — Ambulatory Visit (INDEPENDENT_AMBULATORY_CARE_PROVIDER_SITE_OTHER): Payer: BLUE CROSS/BLUE SHIELD

## 2015-06-23 ENCOUNTER — Encounter: Payer: Self-pay | Admitting: Podiatry

## 2015-06-23 VITALS — BP 137/80 | HR 82 | Resp 12

## 2015-06-23 DIAGNOSIS — M722 Plantar fascial fibromatosis: Secondary | ICD-10-CM

## 2015-06-23 MED ORDER — DICLOFENAC SODIUM 75 MG PO TBEC: 75.0000 mg | DELAYED_RELEASE_TABLET | Freq: Two times a day (BID) | ORAL | Status: DC

## 2015-06-23 MED ORDER — TRIAMCINOLONE ACETONIDE 10 MG/ML IJ SUSP
10.0000 mg | Freq: Once | INTRAMUSCULAR | Status: AC
  Administered 2015-06-23: 10 mg

## 2015-06-23 NOTE — Progress Notes (Signed)
   Subjective:    Patient ID: Samantha Park, female    DOB: 09-May-1961, 54 y.o.   MRN: [id number]  HPI  PT STATED LT FOOT HEEL BEEN PAINFUL FOR 3MONTHS. HEEL IS GETTING WORSE ESPECIALLY WHEN PUTTING PRESSURE. TRIED ICE, IBUFROPEN-NO RELIEF  Review of Systems  Musculoskeletal: Positive for gait problem.       Objective:   Physical Exam        Assessment & Plan:

## 2015-06-23 NOTE — Patient Instructions (Addendum)

## 2015-07-03 NOTE — Progress Notes (Signed)
Subjective:     Patient ID: Samantha Park, female   DOB: 10-Apr-1961, 54 y.o.   MRN: [id number]  HPI patient states that she has a lot of pain in her left heel for the last 3 months. She's tried ice ibuprofen reduced activity and shoe gear modifications without improvement and has over-the-counter orthotics   Review of Systems  All other systems reviewed and are negative.      Objective:   Physical Exam  Constitutional: She is oriented to person, place, and time.  Cardiovascular: Intact distal pulses.   Musculoskeletal: Normal range of motion.  Neurological: She is oriented to person, place, and time.  Skin: Skin is warm.  Nursing note and vitals reviewed.  neurovascular status intact muscle strength adequate range of motion within normal limits with patient noted to have exquisite discomfort plantar aspect left heel at the insertional point of the tendon into the calcaneus with fluid buildup and moderate depression of the arch noted. No equinus was noted and patient is well oriented 3     Assessment:     Acute plantar fasciitis left with inflammation fluid buildup    Plan:     H&P and x-rays reviewed with patient. Today I injected the plantar fascial left 3 mg Kenalog 5 mg Xylocaine and applied fascial brace with instructions. Gave instructions on physical therapy and reappoint and discussed possible long-term orthotics

## 2015-07-08 ENCOUNTER — Ambulatory Visit (INDEPENDENT_AMBULATORY_CARE_PROVIDER_SITE_OTHER): Payer: BLUE CROSS/BLUE SHIELD | Admitting: Podiatry

## 2015-07-08 ENCOUNTER — Encounter: Payer: Self-pay | Admitting: Podiatry

## 2015-07-08 VITALS — BP 141/84 | HR 69 | Resp 12

## 2015-07-08 DIAGNOSIS — M722 Plantar fascial fibromatosis: Secondary | ICD-10-CM | POA: Diagnosis not present

## 2015-07-08 MED ORDER — TRIAMCINOLONE ACETONIDE 10 MG/ML IJ SUSP
10.0000 mg | Freq: Once | INTRAMUSCULAR | Status: AC
  Administered 2015-07-08: 10 mg

## 2015-07-08 NOTE — Progress Notes (Signed)
Subjective:     Patient ID: Samantha Park, female   DOB: 18-Nov-1960, 54 y.o.   MRN: [id number]  HPI patient states I'm still having quite a bit of pain but I am improved from previous and I know on getting need something for my arches   Review of Systems     Objective:   Physical Exam  neurovascular status intact muscle strength adequate with continued significant inflammation of the plantar aspect of the left heel    Assessment:      plantar fasciitis still present left with some improvement    Plan:      reviewed condition and today injected the plantar fascia 3 mg Kenalog 5 mg Xylocaine and went ahead and scanned for custom orthotic devices to reduce plantar pressure. Reappoint to recheck

## 2015-07-08 NOTE — Patient Instructions (Addendum)

## 2015-08-19 ENCOUNTER — Ambulatory Visit: Payer: BLUE CROSS/BLUE SHIELD | Admitting: *Deleted

## 2015-08-19 DIAGNOSIS — M722 Plantar fascial fibromatosis: Secondary | ICD-10-CM

## 2015-08-19 NOTE — Progress Notes (Signed)
Patient ID: Samantha Park, female   DOB: Jun 14, 1961, 55 y.o.   MRN: [id number] Patient presents for orthotic pick up.  Verbal and written break in and wear instructions given.  Patient will follow up in 4 weeks if symptoms worsen or fail to improve.

## 2015-08-19 NOTE — Patient Instructions (Signed)

## 2016-01-11 ENCOUNTER — Other Ambulatory Visit: Payer: Self-pay | Admitting: Family Medicine

## 2016-01-11 DIAGNOSIS — Z1231 Encounter for screening mammogram for malignant neoplasm of breast: Secondary | ICD-10-CM

## 2016-01-17 ENCOUNTER — Ambulatory Visit (INDEPENDENT_AMBULATORY_CARE_PROVIDER_SITE_OTHER): Payer: BLUE CROSS/BLUE SHIELD

## 2016-01-17 ENCOUNTER — Encounter: Payer: Self-pay | Admitting: Podiatry

## 2016-01-17 ENCOUNTER — Ambulatory Visit (INDEPENDENT_AMBULATORY_CARE_PROVIDER_SITE_OTHER): Payer: BLUE CROSS/BLUE SHIELD | Admitting: Podiatry

## 2016-01-17 DIAGNOSIS — M722 Plantar fascial fibromatosis: Secondary | ICD-10-CM

## 2016-01-17 DIAGNOSIS — M79672 Pain in left foot: Secondary | ICD-10-CM

## 2016-01-17 MED ORDER — TRIAMCINOLONE ACETONIDE 10 MG/ML IJ SUSP
10.0000 mg | Freq: Once | INTRAMUSCULAR | Status: AC
  Administered 2016-01-17: 10 mg

## 2016-01-17 NOTE — Patient Instructions (Signed)

## 2016-01-17 NOTE — Progress Notes (Signed)
Subjective:     Patient ID: Samantha Park, female   DOB: [birth year], 55 y.o.   MRN: [id number]  HPI patient presents with a lot of pain in the left plantar heel at the insertional point of the tendon into the calcaneus with fluid buildup   Review of Systems     Objective:   Physical Exam Neurovascular status intact with continued discomfort in the plantar heel left at the insertional point of the tendon into the calcaneus    Assessment:     Plantar fasciitis left chronic in nature with failure to response so far to conservative treatment    Plan:     Discussed the importance of exercises and today I reinjected the plantar fascial left 3 mg Kenalog 5 mg Xylocaine and I recommended physical therapy and dispensed night splint with instructions on usage

## 2016-01-20 ENCOUNTER — Ambulatory Visit
Admission: RE | Admit: 2016-01-20 | Discharge: 2016-01-20 | Disposition: A | Discharge status: Home / Self Care | Payer: BLUE CROSS/BLUE SHIELD | Source: Ambulatory Visit | Attending: Family Medicine | Admitting: Family Medicine

## 2016-01-20 DIAGNOSIS — Z1231 Encounter for screening mammogram for malignant neoplasm of breast: Secondary | ICD-10-CM

## 2016-02-16 ENCOUNTER — Ambulatory Visit (INDEPENDENT_AMBULATORY_CARE_PROVIDER_SITE_OTHER): Payer: BLUE CROSS/BLUE SHIELD | Admitting: Podiatry

## 2016-02-16 ENCOUNTER — Encounter: Payer: Self-pay | Admitting: Podiatry

## 2016-02-16 DIAGNOSIS — M722 Plantar fascial fibromatosis: Secondary | ICD-10-CM | POA: Diagnosis not present

## 2016-02-16 MED ORDER — TRIAMCINOLONE ACETONIDE 10 MG/ML IJ SUSP
10.0000 mg | Freq: Once | INTRAMUSCULAR | Status: AC
  Administered 2016-02-16: 10 mg

## 2016-03-15 ENCOUNTER — Encounter: Payer: Self-pay | Admitting: Podiatry

## 2016-03-15 ENCOUNTER — Ambulatory Visit (INDEPENDENT_AMBULATORY_CARE_PROVIDER_SITE_OTHER): Payer: BLUE CROSS/BLUE SHIELD | Admitting: Podiatry

## 2016-03-15 DIAGNOSIS — M722 Plantar fascial fibromatosis: Secondary | ICD-10-CM | POA: Diagnosis not present

## 2016-03-15 DIAGNOSIS — M79672 Pain in left foot: Secondary | ICD-10-CM | POA: Diagnosis not present

## 2016-03-15 NOTE — Progress Notes (Signed)
Subjective:     Patient ID: Samantha Park, female   DOB: 13-Jun-1961, 55 y.o.   MRN: [id number]  HPI patient presents stating she feels quite a bit better but her orthotics or squeaky and she still has moderate discomfort   Review of Systems     Objective:   Physical Exam Neurovascular status intact with reduce discomfort in the foot with mild inflammation still noted upon deep palpation to the heel    Assessment:     Significant improvement plantar fasciitis with pain still present    Plan:     Advised on physical therapy anti-inflammatories stretching and ice. Reappoint as needed

## 2016-04-06 NOTE — Progress Notes (Signed)
Subjective:     Patient ID: Samantha Park, female   DOB: June 25, 1961, 55 y.o.   MRN: [id number]  HPI patient states she's doing well but still has some discomfort on the outside of the foot   Review of Systems     Objective:   Physical Exam Neurovascular status intact with tendinitis left lateral foot with inflammation fluid buildup but improved from previous visit    Assessment:     Improvement of tendinitis    Plan:     Careful injection administered 3 Milligan Kenalog 5 mill grams Xylocaine and advised on ice therapy anti-inflammatories

## 2016-05-10 ENCOUNTER — Ambulatory Visit: Payer: BLUE CROSS/BLUE SHIELD | Admitting: Podiatry

## 2016-05-14 ENCOUNTER — Telehealth: Payer: Self-pay | Admitting: *Deleted

## 2016-05-14 ENCOUNTER — Encounter: Payer: Self-pay | Admitting: Podiatry

## 2016-05-14 ENCOUNTER — Ambulatory Visit (INDEPENDENT_AMBULATORY_CARE_PROVIDER_SITE_OTHER): Payer: BLUE CROSS/BLUE SHIELD | Admitting: Podiatry

## 2016-05-14 DIAGNOSIS — M722 Plantar fascial fibromatosis: Secondary | ICD-10-CM

## 2016-05-14 NOTE — Telephone Encounter (Signed)
Pt states she forgot to tell Dr. Charlsie Merlesegal that she rides a motorcycle and has the pain in the foot on the shifter.

## 2016-05-14 NOTE — Progress Notes (Signed)
Subjective:     Patient ID: Samantha Park, female   DOB: 05-14-1961, 55 y.o.   MRN: [id number]  HPI patient presents stating that she's doing pretty well but her orthotics still do squeak and she feels like she needs a softer pair for different types of shoes and she is having discomfort   Review of Systems     Objective:   Physical Exam Neurovascular status intact with continued inflammation the plantar heel region bilateral with fluid buildup localized in nature    Assessment:     Continued plantar fasciitis with inflammation noted of a chronic moderate condition    Plan:     H&P condition done and scanned for a softer type orthotic to reduce pressure against the heel. We will utilize a [employer] device with a deep heel cup and she'll be seen back for us to recheck again when ready and may require further injection treatment

## 2016-05-14 NOTE — Patient Instructions (Signed)

## 2016-06-04 ENCOUNTER — Ambulatory Visit: Payer: BLUE CROSS/BLUE SHIELD

## 2016-06-04 DIAGNOSIS — M722 Plantar fascial fibromatosis: Secondary | ICD-10-CM

## 2016-06-04 NOTE — Patient Instructions (Signed)

## 2016-06-05 NOTE — Progress Notes (Signed)
Pt presents to pick up orthotics, she tried inserts on and noted several uncomfortable places. When compared to her original orthotics the arch and heel support were completley different. They rescanned her for her 2nd pair of orthotics and the 2nd pair are not similar to the first. Left notes regarding this in the casting room and will discuss with Melody and will send orthotics back to be redone with original scans

## 2016-06-22 ENCOUNTER — Ambulatory Visit (INDEPENDENT_AMBULATORY_CARE_PROVIDER_SITE_OTHER): Payer: BLUE CROSS/BLUE SHIELD | Admitting: Podiatry

## 2016-06-22 ENCOUNTER — Encounter: Payer: Self-pay | Admitting: Podiatry

## 2016-06-22 DIAGNOSIS — Q828 Other specified congenital malformations of skin: Secondary | ICD-10-CM

## 2016-06-22 DIAGNOSIS — M779 Enthesopathy, unspecified: Secondary | ICD-10-CM | POA: Diagnosis not present

## 2016-06-22 MED ORDER — TRIAMCINOLONE ACETONIDE 10 MG/ML IJ SUSP
10.0000 mg | Freq: Once | INTRAMUSCULAR | Status: AC
  Administered 2016-06-22: 10 mg

## 2016-06-22 NOTE — Progress Notes (Signed)
Subjective:     Patient ID: Samantha Park, female   DOB: August 04, 1960, 55 y.o.   MRN: [id number]  HPI patient presents stating I developed this pain in the outside of my left foot with what I think is a lesion and I feel like there is fluid buildup and it is painful when pressed   Review of Systems     Objective:   Physical Exam Neurovascular status intact with heel pain that resolved with patient found to have an inflammatory fifth MPJ left with fluid buildup and keratotic lesion that's painful when pressed with lucent core    Assessment:     Inflammatory capsulitis fifth MPJ left with plantar keratotic lesion noted    Plan:     Careful injection administered plantar capsule left 3 mg Dexon some Kenalog 5 mg Xylocaine and then after appropriate numbness debrided the lesion fully and reappoint as needed

## 2016-07-12 ENCOUNTER — Ambulatory Visit: Payer: BLUE CROSS/BLUE SHIELD

## 2016-07-12 DIAGNOSIS — M779 Enthesopathy, unspecified: Secondary | ICD-10-CM

## 2016-07-12 DIAGNOSIS — M722 Plantar fascial fibromatosis: Secondary | ICD-10-CM

## 2016-07-12 NOTE — Patient Instructions (Signed)

## 2016-08-13 NOTE — Progress Notes (Signed)
Patient presents for orthotic pick up.  Verbal and written break in and wear instructions given.  Patient will follow up in 4 weeks if symptoms worsen or fail to improve. 

## 2016-11-30 ENCOUNTER — Ambulatory Visit (INDEPENDENT_AMBULATORY_CARE_PROVIDER_SITE_OTHER): Payer: BLUE CROSS/BLUE SHIELD | Admitting: Podiatry

## 2016-11-30 ENCOUNTER — Encounter: Payer: Self-pay | Admitting: Podiatry

## 2016-11-30 ENCOUNTER — Ambulatory Visit: Payer: BLUE CROSS/BLUE SHIELD | Admitting: Podiatry

## 2016-11-30 DIAGNOSIS — M7750 Other enthesopathy of unspecified foot: Secondary | ICD-10-CM

## 2016-11-30 DIAGNOSIS — M779 Enthesopathy, unspecified: Secondary | ICD-10-CM

## 2016-11-30 DIAGNOSIS — Q828 Other specified congenital malformations of skin: Secondary | ICD-10-CM

## 2016-11-30 MED ORDER — TRIAMCINOLONE ACETONIDE 10 MG/ML IJ SUSP
10.0000 mg | Freq: Once | INTRAMUSCULAR | Status: AC
  Administered 2016-11-30: 10 mg

## 2016-12-01 NOTE — Progress Notes (Signed)
Subjective:    Patient ID: Samantha Park, female   DOB: 56 y.o.   MRN: [id number]   HPI patient states this lesion on my left has started to hurt me a lot again and I feel like I need it offloaded    ROS      Objective:  Physical Exam neurovascular status intact with continued keratotic lesion underneath the left fifth MPJ with fluid buildup and pain when palpated. There is also found to be a waxy core within it and bone pressure     Assessment:    Inflammatory capsulitis fifth MPJ left with lesion formation and chronic pressure secondary to the structure of the bone     Plan:    H&P condition reviewed careful plantar injection administered 3 mg Dexon some Kenalog 5 mill grams Xylocaine and deep debridement of lesion accomplished and scanned for custom orthotics to try to offload weight from this area with possibility long-term for osteotomy and primary removal of the lesion

## 2016-12-18 ENCOUNTER — Telehealth: Payer: Self-pay | Admitting: *Deleted

## 2016-12-18 NOTE — Telephone Encounter (Signed)
Left message that her 2nd pair of orthotics are in and she may pick up or schedule an appointment which ever she prefers

## 2017-06-30 IMAGING — MG 2D DIGITAL SCREENING BILATERAL MAMMOGRAM WITH CAD AND ADJUNCT TO
8 of 12 series · 8 of 28 positions shown · non-contrast
Comparison: Previous exam(s).

CLINICAL DATA: Screening.

EXAM:
2D DIGITAL SCREENING BILATERAL MAMMOGRAM WITH CAD AND ADJUNCT TOMO

[R MLO synth-2D]
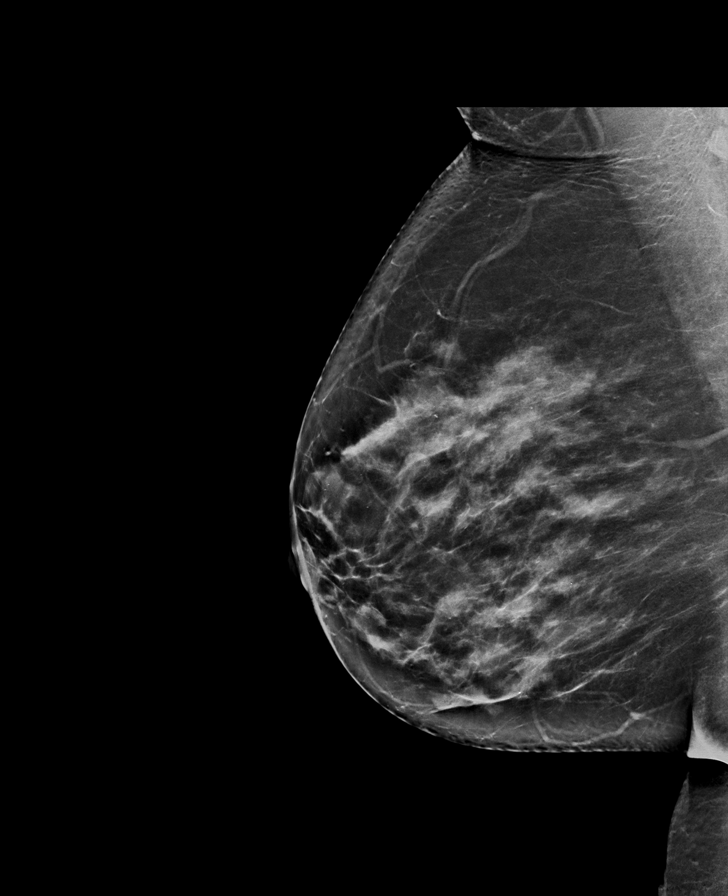

[L CC]
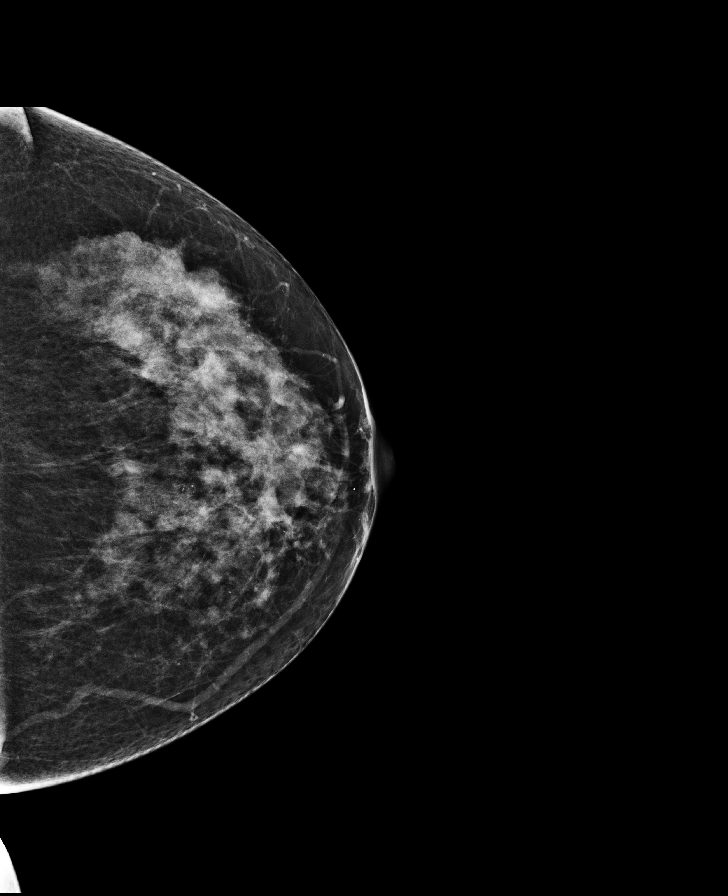

[L CC synth-2D]
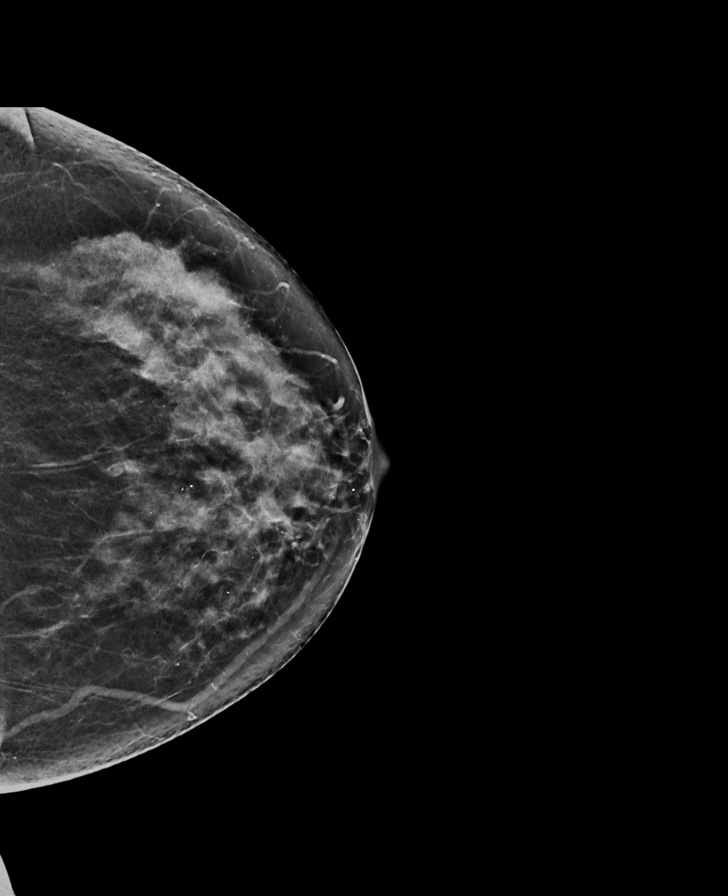

[R MLO]
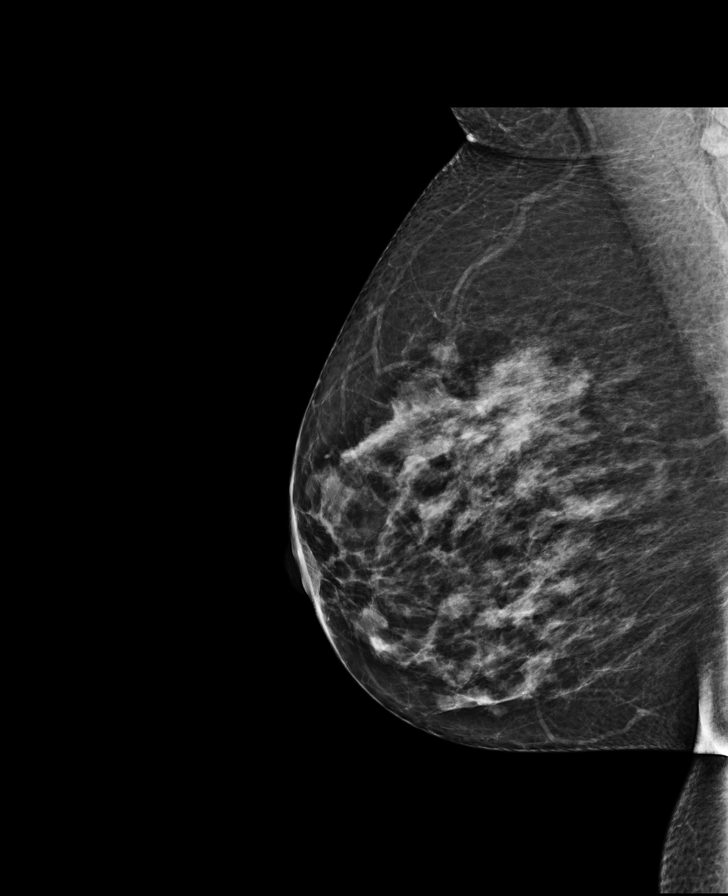

[R CC]
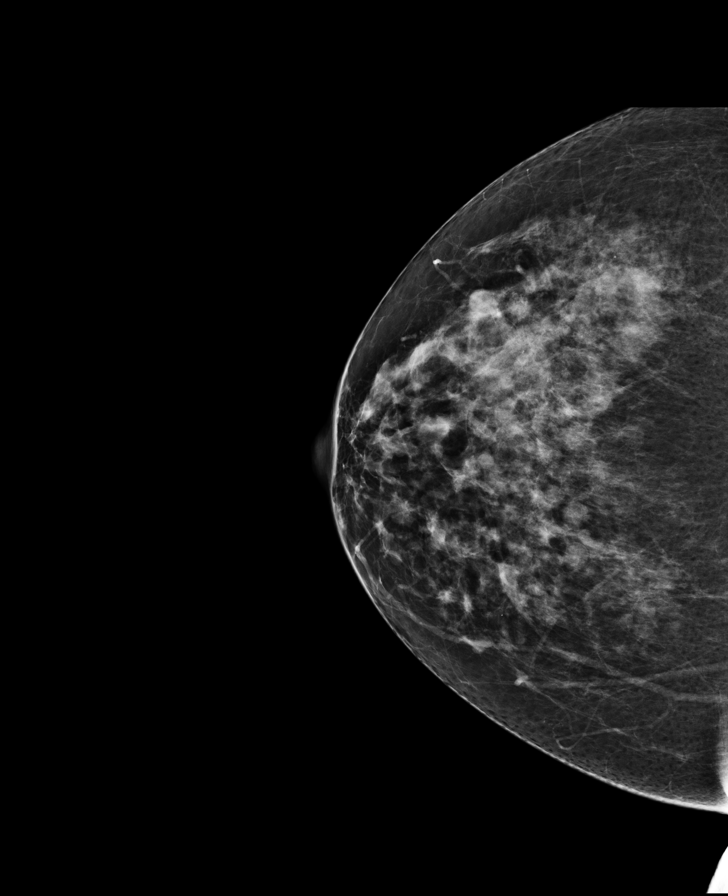

[L MLO]
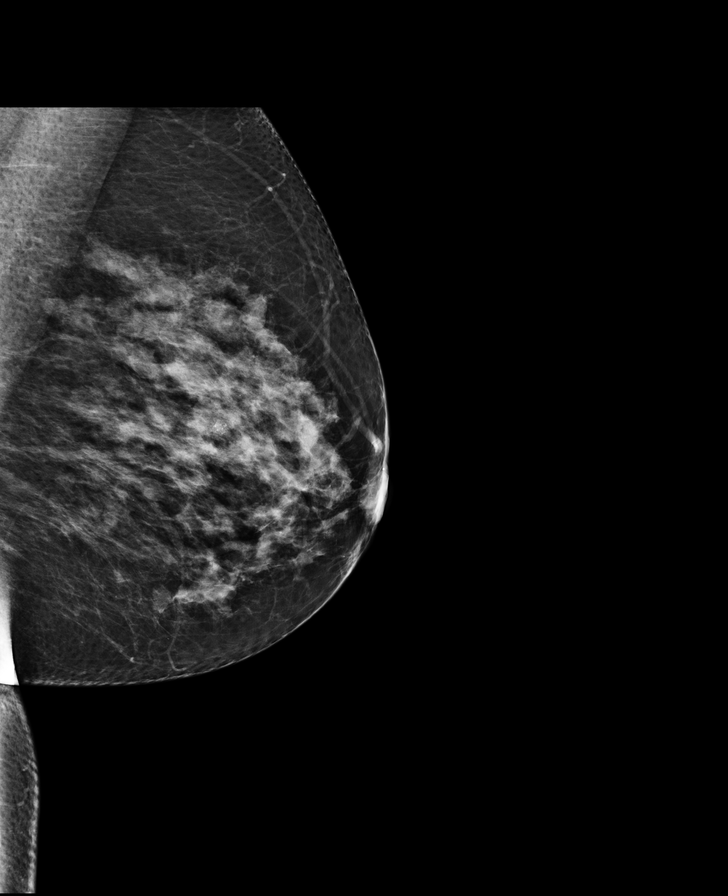

[R CC synth-2D]
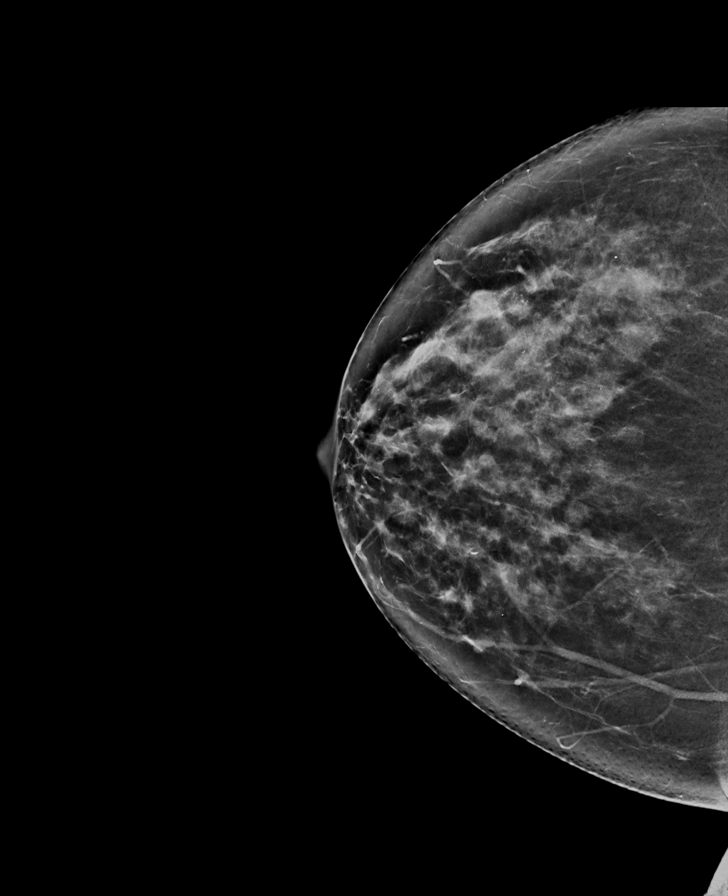

[L MLO synth-2D]
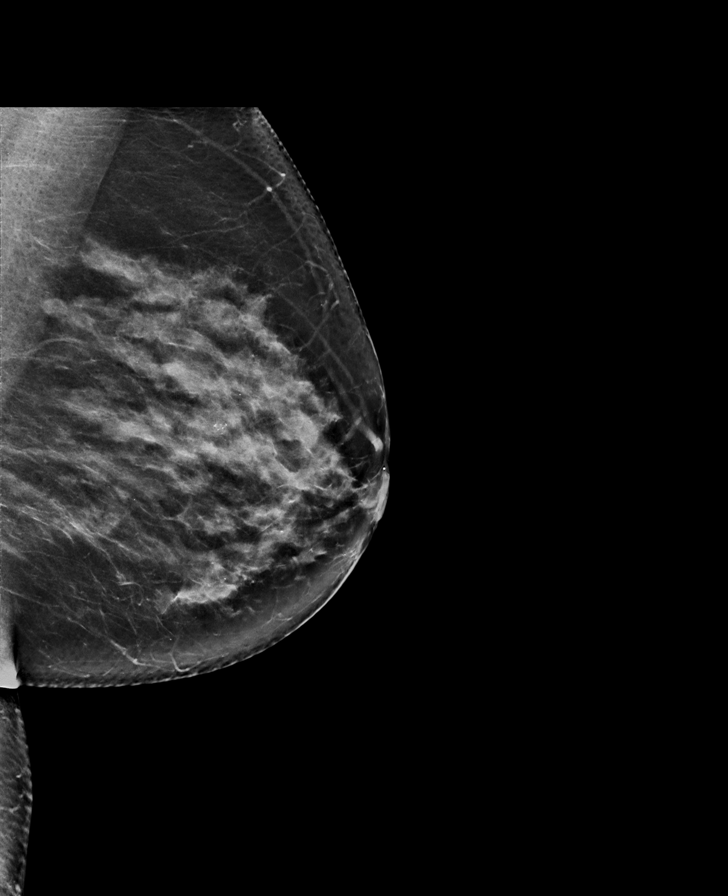

[8 of 28 positions shown; findings below may reference images not displayed]

ACR Breast Density Category c: The breast tissue is heterogeneously
dense, which may obscure small masses.
FINDINGS: There are no findings suspicious for malignancy. Images were
processed with CAD.
IMPRESSION: No mammographic evidence of malignancy. A result letter of this
screening mammogram will be mailed directly to the patient.

RECOMMENDATION:
Screening mammogram in one year. (Code:TN-[id number])

BI-RADS CATEGORY  1: Negative.

## 2018-01-06 DIAGNOSIS — R748 Abnormal levels of other serum enzymes: Secondary | ICD-10-CM

## 2018-01-06 HISTORY — DX: Abnormal levels of other serum enzymes: R74.8

## 2018-02-21 ENCOUNTER — Other Ambulatory Visit: Payer: Self-pay | Admitting: Family Medicine

## 2018-02-21 ENCOUNTER — Ambulatory Visit
Admission: RE | Admit: 2018-02-21 | Discharge: 2018-02-21 | Disposition: A | Discharge status: Home / Self Care | Payer: BLUE CROSS/BLUE SHIELD | Source: Ambulatory Visit | Attending: Family Medicine | Admitting: Family Medicine

## 2018-02-21 DIAGNOSIS — Z1231 Encounter for screening mammogram for malignant neoplasm of breast: Secondary | ICD-10-CM

## 2018-03-25 ENCOUNTER — Other Ambulatory Visit: Payer: Self-pay | Admitting: Family Medicine

## 2018-03-25 DIAGNOSIS — R945 Abnormal results of liver function studies: Principal | ICD-10-CM

## 2018-03-25 DIAGNOSIS — R7989 Other specified abnormal findings of blood chemistry: Secondary | ICD-10-CM

## 2018-04-03 ENCOUNTER — Ambulatory Visit
Admission: RE | Admit: 2018-04-03 | Discharge: 2018-04-03 | Disposition: A | Discharge status: Home / Self Care | Payer: BLUE CROSS/BLUE SHIELD | Source: Ambulatory Visit | Attending: Family Medicine | Admitting: Family Medicine

## 2018-04-03 DIAGNOSIS — R945 Abnormal results of liver function studies: Principal | ICD-10-CM

## 2018-04-03 DIAGNOSIS — R7989 Other specified abnormal findings of blood chemistry: Secondary | ICD-10-CM

## 2018-04-09 ENCOUNTER — Other Ambulatory Visit: Payer: Self-pay | Admitting: Family Medicine

## 2018-04-09 DIAGNOSIS — K8689 Other specified diseases of pancreas: Secondary | ICD-10-CM

## 2018-04-09 DIAGNOSIS — N2889 Other specified disorders of kidney and ureter: Secondary | ICD-10-CM

## 2018-04-18 ENCOUNTER — Ambulatory Visit
Admission: RE | Admit: 2018-04-18 | Discharge: 2018-04-18 | Disposition: A | Discharge status: Home / Self Care | Payer: BLUE CROSS/BLUE SHIELD | Source: Ambulatory Visit | Attending: Family Medicine | Admitting: Family Medicine

## 2018-04-18 DIAGNOSIS — N289 Disorder of kidney and ureter, unspecified: Secondary | ICD-10-CM | POA: Diagnosis not present

## 2018-04-18 DIAGNOSIS — N2889 Other specified disorders of kidney and ureter: Secondary | ICD-10-CM

## 2018-04-18 DIAGNOSIS — K8689 Other specified diseases of pancreas: Secondary | ICD-10-CM

## 2018-04-18 MED ORDER — GADOBENATE DIMEGLUMINE 529 MG/ML IV SOLN
20.0000 mL | Freq: Once | INTRAVENOUS | Status: AC | PRN
  Administered 2018-04-18: 20 mL via INTRAVENOUS

## 2018-05-02 DIAGNOSIS — Z23 Encounter for immunization: Secondary | ICD-10-CM | POA: Diagnosis not present

## 2018-05-02 DIAGNOSIS — R42 Dizziness and giddiness: Secondary | ICD-10-CM | POA: Diagnosis not present

## 2018-05-02 DIAGNOSIS — R05 Cough: Secondary | ICD-10-CM | POA: Diagnosis not present

## 2018-05-02 DIAGNOSIS — Q759 Congenital malformation of skull and face bones, unspecified: Secondary | ICD-10-CM | POA: Diagnosis not present

## 2018-05-16 ENCOUNTER — Other Ambulatory Visit: Payer: Self-pay | Admitting: Family Medicine

## 2018-05-19 ENCOUNTER — Other Ambulatory Visit: Payer: Self-pay | Admitting: Family Medicine

## 2018-05-19 DIAGNOSIS — N1330 Unspecified hydronephrosis: Secondary | ICD-10-CM

## 2018-06-18 ENCOUNTER — Other Ambulatory Visit: Payer: Self-pay | Admitting: Occupational Medicine

## 2018-06-18 ENCOUNTER — Ambulatory Visit: Payer: Self-pay

## 2018-06-18 DIAGNOSIS — M25512 Pain in left shoulder: Secondary | ICD-10-CM

## 2018-06-18 DIAGNOSIS — S4290XA Fracture of unspecified shoulder girdle, part unspecified, initial encounter for closed fracture: Secondary | ICD-10-CM

## 2018-06-18 HISTORY — DX: Fracture of unspecified shoulder girdle, part unspecified, initial encounter for closed fracture: S42.90XA

## 2018-06-25 ENCOUNTER — Ambulatory Visit
Admission: RE | Admit: 2018-06-25 | Discharge: 2018-06-25 | Disposition: A | Discharge status: Home / Self Care | Payer: BLUE CROSS/BLUE SHIELD | Source: Ambulatory Visit | Attending: Family Medicine | Admitting: Family Medicine

## 2018-06-25 DIAGNOSIS — N130 Hydronephrosis with ureteropelvic junction obstruction: Secondary | ICD-10-CM | POA: Diagnosis not present

## 2018-06-25 DIAGNOSIS — N1330 Unspecified hydronephrosis: Secondary | ICD-10-CM

## 2018-06-25 MED ORDER — IOPAMIDOL (ISOVUE-300) INJECTION 61%
125.0000 mL | Freq: Once | INTRAVENOUS | Status: AC | PRN
  Administered 2018-06-25: 125 mL via INTRAVENOUS

## 2018-07-01 DIAGNOSIS — N130 Hydronephrosis with ureteropelvic junction obstruction: Secondary | ICD-10-CM | POA: Diagnosis not present

## 2018-07-01 DIAGNOSIS — N281 Cyst of kidney, acquired: Secondary | ICD-10-CM | POA: Diagnosis not present

## 2018-07-01 DIAGNOSIS — R8271 Bacteriuria: Secondary | ICD-10-CM | POA: Diagnosis not present

## 2018-07-08 ENCOUNTER — Other Ambulatory Visit (HOSPITAL_COMMUNITY): Payer: Self-pay | Admitting: Urology

## 2018-07-08 ENCOUNTER — Other Ambulatory Visit: Payer: Self-pay | Admitting: Urology

## 2018-07-08 DIAGNOSIS — N130 Hydronephrosis with ureteropelvic junction obstruction: Secondary | ICD-10-CM

## 2018-07-14 ENCOUNTER — Other Ambulatory Visit: Payer: BLUE CROSS/BLUE SHIELD

## 2018-08-06 ENCOUNTER — Ambulatory Visit (HOSPITAL_COMMUNITY): Payer: BLUE CROSS/BLUE SHIELD

## 2018-08-20 ENCOUNTER — Ambulatory Visit (HOSPITAL_COMMUNITY)
Admission: RE | Admit: 2018-08-20 | Discharge: 2018-08-20 | Disposition: A | Discharge status: Home / Self Care | Payer: BLUE CROSS/BLUE SHIELD | Source: Ambulatory Visit | Attending: Urology | Admitting: Urology

## 2018-08-20 DIAGNOSIS — N1339 Other hydronephrosis: Secondary | ICD-10-CM | POA: Diagnosis not present

## 2018-08-20 DIAGNOSIS — N130 Hydronephrosis with ureteropelvic junction obstruction: Secondary | ICD-10-CM | POA: Insufficient documentation

## 2018-08-20 MED ORDER — TECHNETIUM TC 99M MERTIATIDE: 55.1000 | Freq: Once | INTRAVENOUS | Status: DC | PRN

## 2018-08-20 MED ORDER — FUROSEMIDE 10 MG/ML IJ SOLN
58.0000 mg | Freq: Once | INTRAMUSCULAR | Status: DC
  Administered 2018-08-20: 50 mg via INTRAVENOUS

## 2018-08-20 MED ORDER — FUROSEMIDE 10 MG/ML IJ SOLN: 58.0000 mg | Freq: Once | INTRAMUSCULAR | Status: DC

## 2018-08-20 MED ORDER — FUROSEMIDE 10 MG/ML IJ SOLN
INTRAMUSCULAR | Status: AC
  Filled 2018-08-20: qty 8

## 2018-08-20 MED ORDER — TECHNETIUM TC 99M MERTIATIDE
5.1000 | Freq: Once | INTRAVENOUS | Status: AC | PRN
  Administered 2018-08-20: 5.1 via INTRAVENOUS

## 2018-08-26 ENCOUNTER — Other Ambulatory Visit: Payer: Self-pay

## 2018-08-26 ENCOUNTER — Encounter (HOSPITAL_COMMUNITY): Payer: BLUE CROSS/BLUE SHIELD

## 2018-08-26 ENCOUNTER — Encounter (HOSPITAL_BASED_OUTPATIENT_CLINIC_OR_DEPARTMENT_OTHER): Payer: Self-pay | Admitting: *Deleted

## 2018-08-26 NOTE — Progress Notes (Signed)
ekg dr Selena Batten 03-18-18 on chart

## 2018-08-26 NOTE — Progress Notes (Signed)
Requested ekg dr Selena Batten

## 2018-08-26 NOTE — Progress Notes (Signed)
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

## 2018-08-28 DIAGNOSIS — I10 Essential (primary) hypertension: Secondary | ICD-10-CM | POA: Diagnosis not present

## 2018-08-28 DIAGNOSIS — E559 Vitamin D deficiency, unspecified: Secondary | ICD-10-CM | POA: Diagnosis not present

## 2018-08-28 DIAGNOSIS — R748 Abnormal levels of other serum enzymes: Secondary | ICD-10-CM | POA: Diagnosis not present

## 2018-08-28 DIAGNOSIS — J4530 Mild persistent asthma, uncomplicated: Secondary | ICD-10-CM | POA: Diagnosis not present

## 2018-08-29 ENCOUNTER — Encounter (HOSPITAL_BASED_OUTPATIENT_CLINIC_OR_DEPARTMENT_OTHER)
Admission: RE | Disposition: A | Discharge status: Home / Self Care | Payer: Self-pay | Source: Home / Self Care | Attending: Urology

## 2018-08-29 ENCOUNTER — Encounter (HOSPITAL_BASED_OUTPATIENT_CLINIC_OR_DEPARTMENT_OTHER): Payer: Self-pay | Admitting: Emergency Medicine

## 2018-08-29 ENCOUNTER — Ambulatory Visit (HOSPITAL_BASED_OUTPATIENT_CLINIC_OR_DEPARTMENT_OTHER): Payer: BLUE CROSS/BLUE SHIELD | Admitting: Anesthesiology

## 2018-08-29 ENCOUNTER — Ambulatory Visit (HOSPITAL_BASED_OUTPATIENT_CLINIC_OR_DEPARTMENT_OTHER)
Admission: RE | Admit: 2018-08-29 | Discharge: 2018-08-29 | Disposition: A | Discharge status: Home / Self Care | Payer: BLUE CROSS/BLUE SHIELD | Attending: Urology | Admitting: Urology

## 2018-08-29 ENCOUNTER — Other Ambulatory Visit: Payer: Self-pay

## 2018-08-29 DIAGNOSIS — E559 Vitamin D deficiency, unspecified: Secondary | ICD-10-CM | POA: Diagnosis not present

## 2018-08-29 DIAGNOSIS — I10 Essential (primary) hypertension: Secondary | ICD-10-CM | POA: Diagnosis not present

## 2018-08-29 DIAGNOSIS — E669 Obesity, unspecified: Secondary | ICD-10-CM | POA: Insufficient documentation

## 2018-08-29 DIAGNOSIS — Z885 Allergy status to narcotic agent status: Secondary | ICD-10-CM | POA: Insufficient documentation

## 2018-08-29 DIAGNOSIS — Z888 Allergy status to other drugs, medicaments and biological substances status: Secondary | ICD-10-CM | POA: Diagnosis not present

## 2018-08-29 DIAGNOSIS — N135 Crossing vessel and stricture of ureter without hydronephrosis: Secondary | ICD-10-CM | POA: Diagnosis not present

## 2018-08-29 DIAGNOSIS — Z881 Allergy status to other antibiotic agents status: Secondary | ICD-10-CM | POA: Diagnosis not present

## 2018-08-29 DIAGNOSIS — Z6835 Body mass index (BMI) 35.0-35.9, adult: Secondary | ICD-10-CM | POA: Diagnosis not present

## 2018-08-29 DIAGNOSIS — J45909 Unspecified asthma, uncomplicated: Secondary | ICD-10-CM | POA: Diagnosis not present

## 2018-08-29 DIAGNOSIS — N1330 Unspecified hydronephrosis: Secondary | ICD-10-CM | POA: Diagnosis not present

## 2018-08-29 DIAGNOSIS — Z886 Allergy status to analgesic agent status: Secondary | ICD-10-CM | POA: Insufficient documentation

## 2018-08-29 DIAGNOSIS — N281 Cyst of kidney, acquired: Secondary | ICD-10-CM | POA: Insufficient documentation

## 2018-08-29 HISTORY — DX: Adverse effect of unspecified anesthetic, initial encounter: T41.45XA

## 2018-08-29 HISTORY — DX: Other complications of anesthesia, initial encounter: T88.59XA

## 2018-08-29 HISTORY — DX: Cyst of kidney, acquired: N28.1

## 2018-08-29 HISTORY — DX: Nausea with vomiting, unspecified: Z98.890

## 2018-08-29 HISTORY — DX: Abnormal levels of other serum enzymes: R74.8

## 2018-08-29 HISTORY — DX: Nausea with vomiting, unspecified: R11.2

## 2018-08-29 HISTORY — DX: Influenza due to unidentified influenza virus with other respiratory manifestations: J11.1

## 2018-08-29 HISTORY — PX: CYSTOSCOPY/RETROGRADE/URETEROSCOPY: SHX5316

## 2018-08-29 HISTORY — DX: Fracture of unspecified shoulder girdle, part unspecified, initial encounter for closed fracture: S42.90XA

## 2018-08-29 LAB — COMPREHENSIVE METABOLIC PANEL
ALT: 59 U/L — ABNORMAL HIGH (ref 0–44)
AST: 44 U/L — ABNORMAL HIGH (ref 15–41)
Albumin: 4.1 g/dL (ref 3.5–5.0)
Alkaline Phosphatase: 403 U/L — ABNORMAL HIGH (ref 38–126)
Anion gap: 6 (ref 5–15)
BUN: 29 mg/dL — ABNORMAL HIGH (ref 6–20)
CO2: 26 mmol/L (ref 22–32)
CREATININE: 0.81 mg/dL (ref 0.44–1.00)
Calcium: 9.2 mg/dL (ref 8.9–10.3)
Chloride: 107 mmol/L (ref 98–111)
GFR calc non Af Amer: >60 mL/min (ref >60)
Glucose, Bld: 97 mg/dL (ref 70–99)
Potassium: 4 mmol/L (ref 3.5–5.1)
Sodium: 139 mmol/L (ref 135–145)
Total Bilirubin: 0.5 mg/dL (ref 0.3–1.2)
Total Protein: 7.7 g/dL (ref 6.5–8.1)

## 2018-08-29 SURGERY — CYSTOSCOPY/RETROGRADE/URETEROSCOPY
Anesthesia: General | Site: Ureter | Laterality: Left

## 2018-08-29 MED ORDER — FENTANYL CITRATE (PF) 100 MCG/2ML IJ SOLN
INTRAMUSCULAR | Status: AC
  Filled 2018-08-29: qty 2

## 2018-08-29 MED ORDER — LIDOCAINE HCL (CARDIAC) PF 100 MG/5ML IV SOSY
PREFILLED_SYRINGE | INTRAVENOUS | Status: DC | PRN
  Administered 2018-08-29: 80 mg via INTRAVENOUS

## 2018-08-29 MED ORDER — MIDAZOLAM HCL 2 MG/2ML IJ SOLN
INTRAMUSCULAR | Status: AC
  Filled 2018-08-29: qty 2

## 2018-08-29 MED ORDER — DEXAMETHASONE SODIUM PHOSPHATE 10 MG/ML IJ SOLN
INTRAMUSCULAR | Status: AC
  Filled 2018-08-29: qty 1

## 2018-08-29 MED ORDER — KETOROLAC TROMETHAMINE 30 MG/ML IJ SOLN
INTRAMUSCULAR | Status: DC | PRN
  Administered 2018-08-29: 30 mg via INTRAVENOUS

## 2018-08-29 MED ORDER — ONDANSETRON HCL 4 MG/2ML IJ SOLN
INTRAMUSCULAR | Status: DC | PRN
  Administered 2018-08-29: 4 mg via INTRAVENOUS

## 2018-08-29 MED ORDER — DEXAMETHASONE SODIUM PHOSPHATE 4 MG/ML IJ SOLN
INTRAMUSCULAR | Status: DC | PRN
  Administered 2018-08-29: 10 mg via INTRAVENOUS

## 2018-08-29 MED ORDER — FENTANYL CITRATE (PF) 100 MCG/2ML IJ SOLN
25.0000 ug | INTRAMUSCULAR | Status: DC | PRN
  Filled 2018-08-29: qty 1

## 2018-08-29 MED ORDER — KETOROLAC TROMETHAMINE 30 MG/ML IJ SOLN
INTRAMUSCULAR | Status: AC
  Filled 2018-08-29: qty 1

## 2018-08-29 MED ORDER — ONDANSETRON HCL 4 MG/2ML IJ SOLN
INTRAMUSCULAR | Status: AC
  Filled 2018-08-29: qty 2

## 2018-08-29 MED ORDER — GENTAMICIN SULFATE 40 MG/ML IJ SOLN
400.0000 mg | INTRAVENOUS | Status: AC
  Administered 2018-08-29: 400 mg via INTRAVENOUS
  Filled 2018-08-29 (×2): qty 10

## 2018-08-29 MED ORDER — ACETAMINOPHEN 500 MG PO TABS
1000.0000 mg | ORAL_TABLET | Freq: Once | ORAL | Status: AC
  Administered 2018-08-29: 1000 mg via ORAL
  Filled 2018-08-29: qty 2

## 2018-08-29 MED ORDER — GENTAMICIN SULFATE 40 MG/ML IJ SOLN
5.0000 mg/kg | INTRAVENOUS | Status: DC
  Filled 2018-08-29: qty 9.75

## 2018-08-29 MED ORDER — ONDANSETRON HCL 4 MG/2ML IJ SOLN
4.0000 mg | Freq: Once | INTRAMUSCULAR | Status: DC | PRN
  Filled 2018-08-29: qty 2

## 2018-08-29 MED ORDER — IOHEXOL 300 MG/ML  SOLN
INTRAMUSCULAR | Status: DC | PRN
  Administered 2018-08-29: 12 mL

## 2018-08-29 MED ORDER — PROPOFOL 10 MG/ML IV BOLUS
INTRAVENOUS | Status: DC | PRN
  Administered 2018-08-29: 200 mg via INTRAVENOUS

## 2018-08-29 MED ORDER — LIDOCAINE 2% (20 MG/ML) 5 ML SYRINGE
INTRAMUSCULAR | Status: AC
  Filled 2018-08-29: qty 5

## 2018-08-29 MED ORDER — FENTANYL CITRATE (PF) 100 MCG/2ML IJ SOLN
INTRAMUSCULAR | Status: DC | PRN
  Administered 2018-08-29: 25 ug via INTRAVENOUS
  Administered 2018-08-29: 50 ug via INTRAVENOUS
  Administered 2018-08-29: 25 ug via INTRAVENOUS

## 2018-08-29 MED ORDER — ACETAMINOPHEN 500 MG PO TABS
ORAL_TABLET | ORAL | Status: AC
  Filled 2018-08-29: qty 2

## 2018-08-29 MED ORDER — LACTATED RINGERS IV SOLN
INTRAVENOUS | Status: DC
  Administered 2018-08-29 (×3): via INTRAVENOUS
  Filled 2018-08-29: qty 1000

## 2018-08-29 MED ORDER — TRAMADOL HCL 50 MG PO TABS: 50.0000 mg | ORAL_TABLET | Freq: Four times a day (QID) | ORAL | 0 refills | Status: DC | PRN

## 2018-08-29 SURGICAL SUPPLY — 23 items
BAG DRAIN URO-CYSTO SKYTR STRL (DRAIN) ×3 IMPLANT
BASKET LASER NITINOL 1.9FR (BASKET) IMPLANT
CATH INTERMIT  6FR 70CM (CATHETERS) IMPLANT
CLOTH BEACON ORANGE TIMEOUT ST (SAFETY) ×3 IMPLANT
FIBER LASER FLEXIVA 365 (UROLOGICAL SUPPLIES) IMPLANT
FIBER LASER TRAC TIP (UROLOGICAL SUPPLIES) IMPLANT
GLOVE BIO SURGEON STRL SZ7.5 (GLOVE) ×3 IMPLANT
GOWN STRL REUS W/TWL LRG LVL3 (GOWN DISPOSABLE) ×3 IMPLANT
GUIDEWIRE ANG ZIPWIRE 038X150 (WIRE) ×3 IMPLANT
GUIDEWIRE STR DUAL SENSOR (WIRE) ×3 IMPLANT
INFUSOR MANOMETER BAG 3000ML (MISCELLANEOUS) ×3 IMPLANT
IV NS 1000ML (IV SOLUTION) ×2
IV NS 1000ML BAXH (IV SOLUTION) ×1 IMPLANT
IV NS IRRIG 3000ML ARTHROMATIC (IV SOLUTION) ×3 IMPLANT
KIT TURNOVER CYSTO (KITS) ×3 IMPLANT
MANIFOLD NEPTUNE II (INSTRUMENTS) ×3 IMPLANT
NS IRRIG 500ML POUR BTL (IV SOLUTION) ×6 IMPLANT
PACK CYSTO (CUSTOM PROCEDURE TRAY) ×3 IMPLANT
SYR 10ML LL (SYRINGE) ×3 IMPLANT
TUBE CONNECTING 12'X1/4 (SUCTIONS) ×1
TUBE CONNECTING 12X1/4 (SUCTIONS) ×2 IMPLANT
TUBE FEEDING 8FR 16IN STR KANG (MISCELLANEOUS) ×3 IMPLANT
TUBING UROLOGY SET (TUBING) ×3 IMPLANT

## 2018-08-29 NOTE — Anesthesia Preprocedure Evaluation (Addendum)
Anesthesia Evaluation  Patient identified by MRN, date of birth, ID band Patient awake    Reviewed: Allergy & Precautions, NPO status , Patient's Chart, lab work & pertinent test results  History of Anesthesia Complications (+) PONV and history of anesthetic complications  Airway Mallampati: II  TM Distance: >3 FB Neck ROM: Full    Dental no notable dental hx.    Pulmonary asthma ,    Pulmonary exam normal breath sounds clear to auscultation       Cardiovascular hypertension, Pt. on medications Normal cardiovascular exam Rhythm:Regular Rate:Normal     Neuro/Psych negative neurological ROS  negative psych ROS   GI/Hepatic negative GI ROS, Neg liver ROS,   Endo/Other  negative endocrine ROS  Renal/GU negative Renal ROS     Musculoskeletal negative musculoskeletal ROS (+)   Abdominal (+) + obese,   Peds  Hematology negative hematology ROS (+)   Anesthesia Other Findings LEFT HYDRONEPHROSIS  Reproductive/Obstetrics                            Anesthesia Physical Anesthesia Plan  ASA: III  Anesthesia Plan: General   Post-op Pain Management:    Induction: Intravenous  PONV Risk Score and Plan: 4 or greater and Ondansetron, Dexamethasone and Treatment may vary due to age or medical condition  Airway Management Planned: LMA  Additional Equipment:   Intra-op Plan:   Post-operative Plan: Extubation in OR  Informed Consent: I have reviewed the patients History and Physical, chart, labs and discussed the procedure including the risks, benefits and alternatives for the proposed anesthesia with the patient or authorized representative who has indicated his/her understanding and acceptance.     Dental advisory given  Plan Discussed with: CRNA  Anesthesia Plan Comments:        Anesthesia Quick Evaluation

## 2018-08-29 NOTE — H&P (Signed)
Samantha Park is an 58 y.o. female.    Chief Complaint: Pre-Op LEFT Diagnostic Ureteroscopy  HPI:   1 - Moderate Left Hydronephrosis / Partial UPJO / Compression from Cyst - incidental left hydro and peripelvic cysts by [place], then CT/MRI 06/2018 on eval fatty liver. Cr 0.8. No post-prandial flank pain. No significant renal parenchymal loss. 1 artery (?small lower pole branch) / 1 vein (large lumbar below artery, and large gonadal) left renovascular anatomy. Suspect mild UPJO mostly from mass effect of cyst compressing against lower pole vessel. No imaging series (includign priro delays on CT) clealry demostrate UPJ ureteral anatomy or contrast filling past renal pelvis. Baseline renogram 08/2018 with Rt 58% / Lt 42 % relative function and NO high grade obstruction with T1/2 <45min bilaterally.   2 - Non-Complex Left Renal Pelvis Cyst - about 5cm left lower pole simple cyst with some mass effect on UPJ as per above.   PMH sig for obesity, C spine surgery (no deficits), HTN. No ischemic CV disease / blood thinners. 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 Her PCP is Samantha Park   Today " Samantha Park " is seen to proceed with diagnostic left ureteroscopy to verify left intra-renal anatomy and maximally rule out significant renal obstruction. No interval fevers.    Past Medical History:  Diagnosis Date  . Anemia    yrs ago  . Asthma   . Complication of anesthesia    trouble waking up after knee surgery 2014, limited rom left shoulder  . Cyst of left kidney   . Elevated liver enzymes 01/2018   not current problem per pt, no liver problems noted with testing done  . Flu 1-3-0-2020   recovered now, spouse had flu took tamiflu  . Hypertension   . PONV (postoperative nausea and vomiting)   . Shoulder fracture 06/18/2018   left in physical therpay now  . Vitamin D deficiency    hx of    Past Surgical History:  Procedure Laterality Date  . BACK SURGERY  2016   cervical spine fusion  . KNEE ARTHROSCOPY Left  july 2011  . KNEE ARTHROSCOPY WITH MEDIAL MENISECTOMY Right 12/31/2012   Procedure: RIGHT KNEE ARTHROSCOPY WITH MEDIAL MENISECTOMY;  Surgeon: Jacki Cones, MD;  Location: [facility];  Service: Orthopedics;  Laterality: Right;  . NASAL SEPTUM SURGERY  1984  . TOENAIL EXCISION Left 1981   big toe    Family History  Problem Relation Age of Onset  . Breast cancer Neg Hx    Social History:  reports that Samantha Park has never smoked. Samantha Park has never used smokeless tobacco. Samantha Park reports that Samantha Park does not drink alcohol or use drugs.  Allergies:  Allergies  Allergen Reactions  . Codeine Other (See Comments)    Severe rash instantly - mainly Tylenol #3  . Erythromycin Nausea Only  . Stadol [Butorphanol]     n/v  . Tramadol Other (See Comments)    "Looked and acted like I was in a drunk stupor after taking it"  . Meloxicam Itching and Rash    No medications prior to admission.    No results found for this or any previous visit (from the past 48 hour(s)). No results found.  Review of Systems  Constitutional: Negative.  Negative for chills and fever.  HENT: Negative.   Eyes: Negative.   Respiratory: Negative.   Cardiovascular: Negative.   Gastrointestinal: Negative.   Genitourinary: Negative.   Musculoskeletal: Negative.   Skin: Negative.   Neurological: Negative.  Endo/Heme/Allergies: Negative.   Psychiatric/Behavioral: Negative.     Height 5\' 7"  (1.702 m), weight 101.2 kg. Physical Exam  Constitutional: Samantha Park appears well-developed.  HENT:  Head: Normocephalic.  Eyes: Pupils are equal, round, and reactive to light.  Neck: Normal range of motion.  Cardiovascular: Normal rate.  Respiratory: Effort normal.  GI: Soft.  Stable truncal obesity.   Genitourinary:    Genitourinary Comments: No CVAT at present   Musculoskeletal: Normal range of motion.  Neurological: Samantha Park is alert.  Skin: Skin is warm.  Psychiatric: Samantha Park has a normal mood and affect.     Assessment/Plan  Proceed as  planned with LEFT diagnostic ureteroscopy. Risks, benefits, alternatives, expected peri-op course discussed previously and reiterated today.   Sebastian Ache, MD 08/29/2018, 5:46 AM

## 2018-08-29 NOTE — Op Note (Signed)
[name], Park MEDICAL RECORD [id number] ACCOUNT [id number] DATE OF BIRTH:1961-02-20 FACILITY: [facility] LOCATION: [facility] PHYSICIAN:Kinser Fellman, MD  OPERATIVE REPORT  DATE OF PROCEDURE:  08/29/2018  PREOPERATIVE DIAGNOSES:  Left hydronephrosis, likely extrinsic mild compression with partial ureteropelvic junction obstruction.  POSTOPERATIVE DIAGNOSES:  Left hydronephrosis, likely extrinsic mild compression with partial ureteropelvic junction obstruction.  PROCEDURE: 1.  Cystoscopy, left retrograde pyelogram, interpretation. 2.  Left diagnostic ureteroscopy.  ESTIMATED BLOOD LOSS:  Nil.  COMPLICATIONS:  None.  SPECIMENS:  None.  FINDINGS: 1.  Likely extrinsic compression of the ureter focally in the area of the UPJ.  Retrograde pyelogram with mild hydronephrosis above this. 2.  Verification of extrinsic compression of left proximal ureter.  Non-high grade.  No intraluminal lesions noted whatsoever near the area of the UPJ.  Photo documentation performed.  INDICATIONS:  The patient is a pleasant 58 year old lady who was found incidentally to have significant hydronephrosis of the left kidney without ureteronephrosis, concerning for left UPJ obstruction, extrinsic versus intrinsic.  She was referred for  management of this.  Imaging was reviewed.  She was noted to also have some peripelvic cyst as well in a likely lower pole crossing vessel causing multifactorial partial extrinsic compression of the area of the UPJ.  She underwent nuclear medicine  renogram, which corroborated fairly symmetric renal function and no evidence of high-grade obstruction on the left.  She presents for diagnostic ureteroscopy today to rule out intraluminal lesions.  Informed consent was obtained and placed in the medical  record.  PROCEDURE IN DETAIL:  The patient being identified, procedure being left diagnostic ureteroscopy confirmed.  Procedure time-out was performed.  Antibiotics were  administered.  General LMA anesthesia was induced.  The patient was placed into a low  lithotomy position.  A sterile field was created by prepping and draping the vagina, introitus, and proximal thighs using iodine.  Cystourethroscopy was performed using a 22-French rigid cystoscope with offset lens.  Inspection of urinary bladder with no  diverticula, calcifications, papillary lesions.  Ureteral orifices appeared singleton.  The left ureteral orifice was cannulated with a 6-French renal catheter, and left retrograde pyelogram was obtained.  Left retrograde pyelogram demonstrated a single left ureter, single-system left kidney.  There was hydronephrosis without ureteronephrosis.  This was mild to moderate.  There was indentation in the area of the UPJ consistent with likely extrinsic  compression.  This was a mild to moderate grade.  A 0.038 ZIPwire was advanced to lower pole and set aside as a safety wire.  An 8-French feeding tube was placed in the urinary bladder for pressure release, and semirigid ureteroscopy was performed of the  distal orifice of the left ureter alongside a separate sensor working wire.  No mucosal abnormalities were found.  The semirigid scope was exchanged for a dual-channel flexible digital ureteroscope using continuous fluoroscopic guidance over the sensor  working wire to the level of the upper pole, and ureteroscopy was performed of the left kidney, including all calices x3.  There was some blunting and mild to moderate intrarenal hydronephrosis noted in the area of the UPJ as expected.  There was some  folding that appeared to be from extrinsic compression.  No intraluminal lesions were seen.  Photo documentation was performed.  This confirmed again extrinsic compression.  The ureteroscope was then removed under continuous vision.  No significant  mucosal abnormalities were found.  The safety wire was removed.  The bladder was emptied and procedure was terminated.  The  patient tolerated  the procedure well.  No immediate perioperative complications.  The patient was taken to postanesthesia care in  stable condition.  LN/NUANCE  D:08/29/2018 T:08/29/2018 JOB:[id number]

## 2018-08-29 NOTE — Discharge Instructions (Signed)
1 - You may have urinary urgency (bladder spasms) and bloody urine on / off x several days. This is normal.  2 - Call MD or go to ER for fever >102, severe pain / nausea / vomiting not relieved by medications, or acute change in medical status    CYSTOSCOPY HOME CARE INSTRUCTIONS  Activity: Rest for the remainder of the day.  Do not drive or operate equipment today.  You may resume normal activities in one to two days as instructed by your physician.   Meals: Drink plenty of liquids and eat light foods such as gelatin or soup this evening.  You may return to a normal meal plan tomorrow.  Return to Work: You may return to work in one to two days or as instructed by your physician.  Special Instructions / Symptoms: Call your physician if any of these symptoms occur:   -persistent or heavy bleeding  -bleeding which continues after first few urination  -large blood clots that are difficult to pass  -urine stream diminishes or stops completely  -fever equal to or higher than 101 degrees Farenheit.  -cloudy urine with a strong, foul odor  -severe pain  Females should always wipe from front to back after elimination.  You may feel some burning pain when you urinate.  This should disappear with time.  Applying moist heat to the lower abdomen or a hot tub bath may help relieve the pain. \  Follow-Up / Date of Return Visit to Your Physician:  Call for an appointment to arrange follow-up.  Patient Signature:  ________________________________________________________  Nurse's Signature:  ________________________________________________________     Post Anesthesia Home Care Instructions  Activity: Get plenty of rest for the remainder of the day. A responsible individual must stay with you for 24 hours following the procedure.  For the next 24 hours, DO NOT: -Drive a car -Advertising copywriter -Drink alcoholic beverages -Take any medication unless instructed by your physician -Make any  legal decisions or sign important papers.  Meals: Start with liquid foods such as gelatin or soup. Progress to regular foods as tolerated. Avoid greasy, spicy, heavy foods. If nausea and/or vomiting occur, drink only clear liquids until the nausea and/or vomiting subsides. Call your physician if vomiting continues.  Special Instructions/Symptoms: Your throat may feel dry or sore from the anesthesia or the breathing tube placed in your throat during surgery. If this causes discomfort, gargle with warm salt water. The discomfort should disappear within 24 hours.  If you had a scopolamine patch placed behind your ear for the management of post- operative nausea and/or vomiting:  1. The medication in the patch is effective for 72 hours, after which it should be removed.  Wrap patch in a tissue and discard in the trash. Wash hands thoroughly with soap and water. 2. You may remove the patch earlier than 72 hours if you experience unpleasant side effects which may include dry mouth, dizziness or visual disturbances. 3. Avoid touching the patch. Wash your hands with soap and water after contact with the patch.    NO IBUPROFEN PRODUCTS (MOTRIN, ADVIL) OR ALEVE UNTIL 4:55PM TODAY.

## 2018-08-29 NOTE — Anesthesia Procedure Notes (Signed)
Procedure Name: LMA Insertion Date/Time: 08/29/2018 10:29 AM Performed by: Jessica Priest, CRNA Pre-anesthesia Checklist: Patient identified, Emergency Drugs available, Suction available and Patient being monitored Patient Re-evaluated:Patient Re-evaluated prior to induction Oxygen Delivery Method: Circle system utilized Preoxygenation: Pre-oxygenation with 100% oxygen Induction Type: IV induction Ventilation: Mask ventilation without difficulty LMA: LMA inserted LMA Size: 4.0 Number of attempts: 1 Airway Equipment and Method: Bite block Placement Confirmation: positive ETCO2 and breath sounds checked- equal and bilateral Tube secured with: Tape Dental Injury: Teeth and Oropharynx as per pre-operative assessment

## 2018-08-29 NOTE — Transfer of Care (Signed)
Immediate Anesthesia Transfer of Care Note  Patient: Samantha Park  Procedure(s) Performed: Procedure(s) (LRB): CYSTOSCOPY/RETROGRADE/left URETEROSCOPY (Left)  Patient Location: PACU  Anesthesia Type: General  Level of Consciousness: awake, sedated, patient cooperative and responds to stimulation  Airway & Oxygen Therapy: Patient Spontanous Breathing and Patient connected to [place] oxygen  Post-op Assessment: Report given to PACU RN, Post -op Vital signs reviewed and stable and Patient moving all extremities  Post vital signs: Reviewed and stable  Complications: No apparent anesthesia complications

## 2018-08-29 NOTE — Brief Op Note (Signed)
08/29/2018  10:53 AM  PATIENT:  Samantha Park  58 y.o. female  PRE-OPERATIVE DIAGNOSIS:  LEFT HYDRONEPHROSIS  POST-OPERATIVE DIAGNOSIS:  LEFT HYDRONEPHROSIS  PROCEDURE:  Procedure(s): CYSTOSCOPY/RETROGRADE/left URETEROSCOPY (Left)  SURGEON:  Surgeon(s) and Role:    * Sebastian Ache, MD - Primary  PHYSICIAN ASSISTANT:   ASSISTANTS: none   ANESTHESIA:   general  EBL:  0 mL   BLOOD ADMINISTERED:none  DRAINS: none   LOCAL MEDICATIONS USED:  NONE  SPECIMEN:  No Specimen  DISPOSITION OF SPECIMEN:  N/A  COUNTS:  YES  TOURNIQUET:  * No tourniquets in log *  DICTATION: .Other Dictation: Dictation Number [id number]  PLAN OF CARE: Discharge to home after PACU  PATIENT DISPOSITION:  PACU - hemodynamically stable.   Delay start of Pharmacological VTE agent (>24hrs) due to surgical blood loss or risk of bleeding: not applicable

## 2018-08-29 NOTE — Anesthesia Postprocedure Evaluation (Signed)
Anesthesia Post Note  Patient: Samantha Park  Procedure(s) Performed: CYSTOSCOPY/RETROGRADE/left URETEROSCOPY (Left Ureter)     Patient location during evaluation: PACU Anesthesia Type: General Level of consciousness: awake Pain management: pain level controlled Vital Signs Assessment: post-procedure vital signs reviewed and stable Respiratory status: spontaneous breathing, nonlabored ventilation, respiratory function stable and patient connected to nasal cannula oxygen Cardiovascular status: blood pressure returned to baseline and stable Postop Assessment: no apparent nausea or vomiting Anesthetic complications: no    Last Vitals:  Vitals:   08/29/18 1200 08/29/18 1215  BP: 104/66 109/75  Pulse: (!) 52 (!) 49  Resp: 12 16  Temp:  36.4 C  SpO2: 95% 97%    Last Pain:  Vitals:   08/29/18 1215  TempSrc:   PainSc: 2                  Ryan P Ellender

## 2018-09-01 ENCOUNTER — Encounter (HOSPITAL_BASED_OUTPATIENT_CLINIC_OR_DEPARTMENT_OTHER): Payer: Self-pay | Admitting: Urology

## 2018-09-16 DIAGNOSIS — N281 Cyst of kidney, acquired: Secondary | ICD-10-CM | POA: Diagnosis not present

## 2018-09-16 DIAGNOSIS — N130 Hydronephrosis with ureteropelvic junction obstruction: Secondary | ICD-10-CM | POA: Diagnosis not present

## 2018-11-27 DIAGNOSIS — R748 Abnormal levels of other serum enzymes: Secondary | ICD-10-CM | POA: Diagnosis not present

## 2018-12-05 DIAGNOSIS — Z20828 Contact with and (suspected) exposure to other viral communicable diseases: Secondary | ICD-10-CM | POA: Diagnosis not present

## 2019-03-05 DIAGNOSIS — E559 Vitamin D deficiency, unspecified: Secondary | ICD-10-CM | POA: Diagnosis not present

## 2019-03-05 DIAGNOSIS — R748 Abnormal levels of other serum enzymes: Secondary | ICD-10-CM | POA: Diagnosis not present

## 2019-03-05 DIAGNOSIS — I10 Essential (primary) hypertension: Secondary | ICD-10-CM | POA: Diagnosis not present

## 2019-03-10 ENCOUNTER — Other Ambulatory Visit: Payer: Self-pay | Admitting: Family Medicine

## 2019-03-10 ENCOUNTER — Other Ambulatory Visit (HOSPITAL_COMMUNITY)
Admission: RE | Admit: 2019-03-10 | Discharge: 2019-03-10 | Disposition: A | Discharge status: Home / Self Care | Payer: BC Managed Care – PPO | Source: Ambulatory Visit | Attending: Family Medicine | Admitting: Family Medicine

## 2019-03-10 DIAGNOSIS — J4530 Mild persistent asthma, uncomplicated: Secondary | ICD-10-CM | POA: Diagnosis not present

## 2019-03-10 DIAGNOSIS — Z0000 Encounter for general adult medical examination without abnormal findings: Secondary | ICD-10-CM | POA: Diagnosis not present

## 2019-03-10 DIAGNOSIS — Z23 Encounter for immunization: Secondary | ICD-10-CM | POA: Diagnosis not present

## 2019-03-10 DIAGNOSIS — Z124 Encounter for screening for malignant neoplasm of cervix: Secondary | ICD-10-CM | POA: Insufficient documentation

## 2019-03-10 DIAGNOSIS — R748 Abnormal levels of other serum enzymes: Secondary | ICD-10-CM | POA: Diagnosis not present

## 2019-03-10 DIAGNOSIS — I10 Essential (primary) hypertension: Secondary | ICD-10-CM | POA: Diagnosis not present

## 2019-03-12 LAB — CYTOLOGY - PAP
Diagnosis: NEGATIVE
HPV: NOT DETECTED

## 2019-03-20 ENCOUNTER — Other Ambulatory Visit: Payer: Self-pay | Admitting: Family Medicine

## 2019-03-20 DIAGNOSIS — Z1231 Encounter for screening mammogram for malignant neoplasm of breast: Secondary | ICD-10-CM

## 2019-04-30 DIAGNOSIS — R748 Abnormal levels of other serum enzymes: Secondary | ICD-10-CM | POA: Diagnosis not present

## 2019-04-30 DIAGNOSIS — R5382 Chronic fatigue, unspecified: Secondary | ICD-10-CM | POA: Diagnosis not present

## 2019-04-30 DIAGNOSIS — L299 Pruritus, unspecified: Secondary | ICD-10-CM | POA: Diagnosis not present

## 2019-05-05 ENCOUNTER — Ambulatory Visit
Admission: RE | Admit: 2019-05-05 | Discharge: 2019-05-05 | Disposition: A | Discharge status: Home / Self Care | Payer: BC Managed Care – PPO | Source: Ambulatory Visit | Attending: Family Medicine | Admitting: Family Medicine

## 2019-05-05 ENCOUNTER — Other Ambulatory Visit: Payer: Self-pay

## 2019-05-05 DIAGNOSIS — Z1231 Encounter for screening mammogram for malignant neoplasm of breast: Secondary | ICD-10-CM

## 2019-05-07 ENCOUNTER — Other Ambulatory Visit: Payer: Self-pay | Admitting: Gastroenterology

## 2019-05-07 ENCOUNTER — Other Ambulatory Visit (HOSPITAL_COMMUNITY): Payer: Self-pay | Admitting: Gastroenterology

## 2019-05-07 DIAGNOSIS — R5382 Chronic fatigue, unspecified: Secondary | ICD-10-CM

## 2019-05-07 DIAGNOSIS — R748 Abnormal levels of other serum enzymes: Secondary | ICD-10-CM

## 2019-05-07 DIAGNOSIS — L299 Pruritus, unspecified: Secondary | ICD-10-CM

## 2019-05-12 ENCOUNTER — Telehealth (HOSPITAL_COMMUNITY): Payer: Self-pay | Admitting: *Deleted

## 2019-05-12 NOTE — Telephone Encounter (Signed)
Attempted to call patient in regards to VM,  No answer left VM for her to call me at [phone] to have her questions answered

## 2019-05-13 ENCOUNTER — Other Ambulatory Visit: Payer: Self-pay | Admitting: Radiology

## 2019-05-15 ENCOUNTER — Other Ambulatory Visit: Payer: Self-pay

## 2019-05-15 ENCOUNTER — Encounter (HOSPITAL_COMMUNITY): Payer: Self-pay

## 2019-05-15 ENCOUNTER — Ambulatory Visit (HOSPITAL_COMMUNITY)
Admission: RE | Admit: 2019-05-15 | Discharge: 2019-05-15 | Disposition: A | Discharge status: Home / Self Care | Payer: BC Managed Care – PPO | Source: Ambulatory Visit | Attending: Gastroenterology | Admitting: Gastroenterology

## 2019-05-15 DIAGNOSIS — R945 Abnormal results of liver function studies: Secondary | ICD-10-CM | POA: Diagnosis not present

## 2019-05-15 DIAGNOSIS — L299 Pruritus, unspecified: Secondary | ICD-10-CM | POA: Insufficient documentation

## 2019-05-15 DIAGNOSIS — R7989 Other specified abnormal findings of blood chemistry: Secondary | ICD-10-CM | POA: Diagnosis not present

## 2019-05-15 DIAGNOSIS — Z79899 Other long term (current) drug therapy: Secondary | ICD-10-CM | POA: Insufficient documentation

## 2019-05-15 DIAGNOSIS — I10 Essential (primary) hypertension: Secondary | ICD-10-CM | POA: Insufficient documentation

## 2019-05-15 DIAGNOSIS — R748 Abnormal levels of other serum enzymes: Secondary | ICD-10-CM | POA: Diagnosis not present

## 2019-05-15 DIAGNOSIS — K739 Chronic hepatitis, unspecified: Secondary | ICD-10-CM | POA: Diagnosis not present

## 2019-05-15 DIAGNOSIS — R5382 Chronic fatigue, unspecified: Secondary | ICD-10-CM | POA: Insufficient documentation

## 2019-05-15 DIAGNOSIS — E559 Vitamin D deficiency, unspecified: Secondary | ICD-10-CM | POA: Insufficient documentation

## 2019-05-15 LAB — PROTIME-INR
INR: 0.9 (ref 0.8–1.2)
Prothrombin Time: 12.1 seconds (ref 11.4–15.2)

## 2019-05-15 LAB — CBC
HCT: 37.4 % (ref 36.0–46.0)
Hemoglobin: 12.3 g/dL (ref 12.0–15.0)
MCH: 30.4 pg (ref 26.0–34.0)
MCHC: 32.9 g/dL (ref 30.0–36.0)
MCV: 92.3 fL (ref 80.0–100.0)
Platelets: 237 10*3/uL (ref 150–400)
RBC: 4.05 MIL/uL (ref 3.87–5.11)
RDW: 12.4 % (ref 11.5–15.5)
WBC: 6.9 10*3/uL (ref 4.0–10.5)
nRBC: 0 % (ref 0.0–0.2)

## 2019-05-15 MED ORDER — SODIUM CHLORIDE 0.9 % IV SOLN
INTRAVENOUS | Status: AC | PRN
  Administered 2019-05-15: 10 mL/h via INTRAVENOUS

## 2019-05-15 MED ORDER — GELATIN ABSORBABLE 12-7 MM EX MISC
CUTANEOUS | Status: AC
  Filled 2019-05-15: qty 1

## 2019-05-15 MED ORDER — FENTANYL CITRATE (PF) 100 MCG/2ML IJ SOLN
INTRAMUSCULAR | Status: AC | PRN
  Administered 2019-05-15: 25 ug via INTRAVENOUS

## 2019-05-15 MED ORDER — MIDAZOLAM HCL 2 MG/2ML IJ SOLN
INTRAMUSCULAR | Status: AC | PRN
  Administered 2019-05-15: 1 mg via INTRAVENOUS

## 2019-05-15 MED ORDER — MIDAZOLAM HCL 2 MG/2ML IJ SOLN
INTRAMUSCULAR | Status: AC
  Filled 2019-05-15: qty 2

## 2019-05-15 MED ORDER — ONDANSETRON HCL 4 MG/2ML IJ SOLN: 4.0000 mg | Freq: Once | INTRAMUSCULAR | Status: DC

## 2019-05-15 MED ORDER — ONDANSETRON HCL 4 MG/2ML IJ SOLN
INTRAMUSCULAR | Status: AC
  Administered 2019-05-15: 08:00:00 4 mg
  Filled 2019-05-15: qty 2

## 2019-05-15 MED ORDER — SODIUM CHLORIDE 0.9 % IV SOLN: INTRAVENOUS | Status: DC

## 2019-05-15 MED ORDER — FENTANYL CITRATE (PF) 100 MCG/2ML IJ SOLN
INTRAMUSCULAR | Status: AC
  Filled 2019-05-15: qty 2

## 2019-05-15 MED ORDER — LIDOCAINE HCL (PF) 1 % IJ SOLN
INTRAMUSCULAR | Status: AC
  Filled 2019-05-15: qty 30

## 2019-05-15 NOTE — H&P (Addendum)
Chief Complaint: Elevated Alk Phos  Referring Physician(s): Ronnette Juniper  Supervising Physician: Aletta Edouard  Patient Status: [facility] - Out-pt  History of Present Illness: Samantha Park is a 58 y.o. female here today for random liver biopsy.  She has elevated Alk Phos at 403.  Her AST is 44 and ALT is 59.  She is NPO. No blood thinners. No nausea/vomiting. No Fever/chills. ROS negative.   Past Medical History:  Diagnosis Date   Anemia    yrs ago   Asthma    Complication of anesthesia    trouble waking up after knee surgery 2014, limited rom left shoulder   Cyst of left kidney    Elevated liver enzymes 01/2018   not current problem per pt, no liver problems noted with testing done   Flu 1-3-0-2020   recovered now, spouse had flu took tamiflu   Hypertension    PONV (postoperative nausea and vomiting)    Shoulder fracture 06/18/2018   left in physical therpay now   Vitamin D deficiency    hx of    Past Surgical History:  Procedure Laterality Date   BACK SURGERY  2016   cervical spine fusion   CYSTOSCOPY/RETROGRADE/URETEROSCOPY Left 08/29/2018   Procedure: CYSTOSCOPY/RETROGRADE/left URETEROSCOPY;  Surgeon: Alexis Frock, MD;  Location: [facility];  Service: Urology;  Laterality: Left;   KNEE ARTHROSCOPY Left july 2011   KNEE ARTHROSCOPY WITH MEDIAL MENISECTOMY Right 12/31/2012   Procedure: RIGHT KNEE ARTHROSCOPY WITH MEDIAL MENISECTOMY;  Surgeon: Tobi Bastos, MD;  Location: [facility];  Service: Orthopedics;  Laterality: Right;   NASAL SEPTUM SURGERY  1984   TOENAIL EXCISION Left 1981   big toe    Allergies: Codeine, Erythromycin, Ketek [telithromycin], Largon [propiomazine], Stadol [butorphanol], Tramadol, and Meloxicam  Medications: Prior to Admission medications   Medication Sig Start Date End Date Taking? Authorizing Provider  cetirizine (ZYRTEC) 10 MG tablet Take 10 mg by mouth daily.    Yes [provider]    Cholecalciferol (VITAMIN D3) 2000 units TABS Take 1 tablet by mouth daily.   Yes [provider]  losartan-hydrochlorothiazide (HYZAAR) 50-12.5 MG tablet Take 1 tablet by mouth daily.   Yes [provider]  montelukast (SINGULAIR) 10 MG tablet Take 10 mg by mouth every morning.   Yes [provider]  Multiple Vitamins-Minerals (MULTIVITAMIN ADULT PO) Take 2 each by mouth daily. Pt takes multivitamin gummies    Yes [provider]  ibuprofen (ADVIL) 200 MG tablet Take 600 mg by mouth every 6 (six) hours as needed for mild pain.     [provider]  VENTOLIN HFA 108 (90 Base) MCG/ACT inhaler Inhale 1-2 puffs into the lungs every 4 (four) hours as needed for shortness of breath.  02/06/16   [provider]     Family History  Problem Relation Age of Onset   Breast cancer Sister        half sister on mother's side    Social History   Socioeconomic History   Marital status: Married    Spouse name: Not on file   Number of children: Not on file   Years of education: Not on file   Highest education level: Not on file  Occupational History   Not on file  Social Needs   Financial resource strain: Not on file   Food insecurity    Worry: Not on file    Inability: Not on file   Transportation needs  Medical: Not on file    Non-medical: Not on file  Tobacco Use   Smoking status: Never Smoker   Smokeless tobacco: Never Used  Substance and Sexual Activity   Alcohol use: No   Drug use: No   Sexual activity: Not on file  Lifestyle   Physical activity    Days per week: Not on file    Minutes per session: Not on file   Stress: Not on file  Relationships   Social connections    Talks on phone: Not on file    Gets together: Not on file    Attends religious service: Not on file    Active member of club or organization: Not on file    Attends meetings of clubs or organizations: Not on file    Relationship status: Not  on file  Other Topics Concern   Not on file  Social History Narrative   Not on file     Review of Systems: A 12 point ROS discussed and pertinent positives are indicated in the HPI above.  All other systems are negative.  Review of Systems  Vital Signs: BP (!) 141/78    Pulse 72    Temp 97.7 F (36.5 C) (Skin)    Resp 18    Ht '5\' 6"'  (1.676 m)    Wt 111 kg    SpO2 96%    BMI 39.50 kg/m   Physical Exam Vitals signs reviewed.  Constitutional:      Appearance: She is obese.  HENT:     Head: Normocephalic and atraumatic.  Eyes:     Extraocular Movements: Extraocular movements intact.  Neck:     Musculoskeletal: Normal range of motion.  Cardiovascular:     Rate and Rhythm: Normal rate and regular rhythm.  Pulmonary:     Effort: Pulmonary effort is normal.     Breath sounds: Normal breath sounds.  Abdominal:     General: There is no distension.     Palpations: Abdomen is soft.     Tenderness: There is no abdominal tenderness.  Musculoskeletal: Normal range of motion.  Skin:    General: Skin is warm and dry.  Neurological:     General: No focal deficit present.     Mental Status: She is alert and oriented to person, place, and time.  Psychiatric:        Mood and Affect: Mood normal.        Behavior: Behavior normal.        Thought Content: Thought content normal.        Judgment: Judgment normal.     Imaging: Mm 3d Screen Breast Bilateral  Result Date: 05/05/2019 CLINICAL DATA:  Screening. EXAM: DIGITAL SCREENING BILATERAL MAMMOGRAM WITH TOMO AND CAD COMPARISON:  Previous exam(s). ACR Breast Density Category c: The breast tissue is heterogeneously dense, which may obscure small masses. FINDINGS: There are no findings suspicious for malignancy. Images were processed with CAD. IMPRESSION: No mammographic evidence of malignancy. A result letter of this screening mammogram will be mailed directly to the patient. RECOMMENDATION: Screening mammogram in one year.  (Code:SM-B-01Y) BI-RADS CATEGORY  1: Negative. Electronically Signed   By: Ammie Ferrier M.D.   On: 05/05/2019 11:01    Labs:  CBC: Recent Labs    05/15/19 0631  WBC 6.9  HGB 12.3  HCT 37.4  PLT 237    COAGS: No results for input(s): INR, APTT in the last 8760 hours.  BMP: Recent Labs    08/29/18  0855  NA 139  K 4.0  CL 107  CO2 26  GLUCOSE 97  BUN 29*  CALCIUM 9.2  CREATININE 0.81  GFRNONAA >60  GFRAA >60    LIVER FUNCTION TESTS: Recent Labs    08/29/18 0855  BILITOT 0.5  AST 44*  ALT 59*  ALKPHOS 403*  PROT 7.7  ALBUMIN 4.1    TUMOR MARKERS: No results for input(s): AFPTM, CEA, CA199, CHROMGRNA in the last 8760 hours.  Assessment and Plan:  Elevated LFTs  Will proceed with image guided random liver biopsy today by Dr. Kathlene Cote.  Risks and benefits of random liver biopsy was discussed with the patient and/or patient's family including, but not limited to bleeding, infection, damage to adjacent structures or low yield requiring additional tests.  All of the questions were answered and there is agreement to proceed.  Consent signed and in chart.  Thank you for this interesting consult.  I greatly enjoyed meeting Samantha Park and look forward to participating in their care.  A copy of this report was sent to the requesting provider on this date.  Electronically Signed: Murrell Redden, PA-C   05/15/2019, 7:17 AM      I spent a total of  30 Minutes in face to face in clinical consultation, greater than 50% of which was counseling/coordinating care for random liver biopsy.

## 2019-05-15 NOTE — Procedures (Signed)
Interventional Radiology Procedure Note  Procedure: US Guided Biopsy of Liver  Complications: None  Estimated Blood Loss: < 10 mL  Findings: 18 G core biopsy of liver performed under US guidance.  Two core samples obtained and sent to Pathology.  Kailee Essman T. Kyandre Okray, M.D Pager:  [phone]   

## 2019-05-15 NOTE — Discharge Instructions (Signed)
Liver Biopsy, Care After °These instructions give you information about how to care for yourself after your procedure. Your health care provider may also give you more specific instructions. If you have problems or questions, contact your health care provider. °What can I expect after the procedure? °After your procedure, it is common to have: °· Pain and soreness in the area where the biopsy was done. °· Bruising around the area where the biopsy was done. °· Sleepiness and fatigue for 1-2 days. °Follow these instructions at home: °Medicines °· Take over-the-counter and prescription medicines only as told by your health care provider. °· If you were prescribed an antibiotic medicine, take it as told by your health care provider. Do not stop taking the antibiotic even if you start to feel better. °· Do not take medicines such as aspirin and ibuprofen unless your health care provider tells you to take them. These medicines thin your blood and can increase the risk of bleeding. °· If you are taking prescription pain medicine, take actions to prevent or treat constipation. Your health care provider may recommend that you: °? Drink enough fluid to keep your urine pale yellow. °? Eat foods that are high in fiber, such as fresh fruits and vegetables, whole grains, and beans. °? Limit foods that are high in fat and processed sugars, such as fried or sweet foods. °? Take an over-the-counter or prescription medicine for constipation. °Incision care °· Follow instructions from your health care provider about how to take care of your incision. Make sure you: °? Wash your hands with soap and water before you change your bandage (dressing). If soap and water are not available, use hand sanitizer. °? Change your dressing as told by your health care provider. °? Leave stitches (sutures), skin glue, or adhesive strips in place. These skin closures may need to stay in place for 2 weeks or longer. If adhesive strip edges start to  loosen and curl up, you may trim the loose edges. Do not remove adhesive strips completely unless your health care provider tells you to do that. °· Check your incision area every day for signs of infection. Check for: °? Redness, swelling, or pain. °? Fluid or blood. °? Warmth. °? Pus or a bad smell. °· Do not take baths, swim, or use a hot tub until your health care provider says it is okay to do so. °Activity ° °· Rest at home for 1-2 days, or as directed by your health care provider. °? Avoid sitting for a long time without moving. Get up to take short walks every 1-2 hours. This is important to improve blood flow and breathing. Ask for help if you feel weak or unsteady. °· Return to your normal activities as told by your health care provider. Ask your health care provider what activities are safe for you. °· Do not drive or use heavy machinery while taking prescription pain medicine. °· Do not lift anything that is heavier than 10 lb (4.5 kg), or the limit that your health care provider tells you, until he or she says that it is safe. °· Do not play contact sports for 2 weeks after the procedure. °General instructions ° °· Do not drink alcohol in the first week after the procedure. °· Have someone stay with you for at least 24 hours after the procedure. °· It is your responsibility to obtain your test results. Ask your health care provider, or the department that is doing the test: °? When will my   results be ready? °? How will I get my results? °? What are my treatment options? °? What other tests do I need? °? What are my next steps? °· Keep all follow-up visits as told by your health care provider. This is important. °Contact a health care provider if: °· You have increased bleeding from an incision, resulting in more than a small spot of blood. °· You have redness, swelling, or increasing pain in any incisions. °· You notice a discharge or a bad smell coming from any of your incisions. °· You have a fever or  chills. °Get help right away if: °· You develop swelling, bloating, or pain in your abdomen. °· You become dizzy or faint. °· You develop a rash. °· You have nausea or you vomit. °· You faint, or you have shortness of breath or difficulty breathing. °· You develop chest pain. °· You have problems with your speech or vision. °· You have trouble with your balance or moving your arms or legs. °Summary °· After the liver biopsy, it is common to have pain, soreness, and bruising in the area, as well as sleepiness and fatigue. °· Take over-the-counter and prescription medicines only as told by your health care provider. °· Follow instructions from your health care provider about how to care for your incision. Check the incision area daily for signs of infection. °This information is not intended to replace advice given to you by your health care provider. Make sure you discuss any questions you have with your health care provider. °Document Released: 01/12/2005 Document Revised: 08/18/2018 Document Reviewed: 07/05/2017 °[employer] Patient Education © 2020 [employer] ° °

## 2019-05-26 LAB — SURGICAL PATHOLOGY

## 2019-06-10 DIAGNOSIS — K743 Primary biliary cirrhosis: Secondary | ICD-10-CM | POA: Diagnosis not present

## 2019-09-07 DIAGNOSIS — K743 Primary biliary cirrhosis: Secondary | ICD-10-CM | POA: Diagnosis not present

## 2019-09-11 DIAGNOSIS — M545 Low back pain: Secondary | ICD-10-CM | POA: Diagnosis not present

## 2019-09-11 DIAGNOSIS — J4530 Mild persistent asthma, uncomplicated: Secondary | ICD-10-CM | POA: Diagnosis not present

## 2019-09-11 DIAGNOSIS — I10 Essential (primary) hypertension: Secondary | ICD-10-CM | POA: Diagnosis not present

## 2019-09-21 DIAGNOSIS — N130 Hydronephrosis with ureteropelvic junction obstruction: Secondary | ICD-10-CM | POA: Diagnosis not present

## 2019-09-21 DIAGNOSIS — N281 Cyst of kidney, acquired: Secondary | ICD-10-CM | POA: Diagnosis not present

## 2019-11-26 DIAGNOSIS — K743 Primary biliary cirrhosis: Secondary | ICD-10-CM | POA: Diagnosis not present

## 2020-03-16 DIAGNOSIS — E559 Vitamin D deficiency, unspecified: Secondary | ICD-10-CM | POA: Diagnosis not present

## 2020-03-16 DIAGNOSIS — I10 Essential (primary) hypertension: Secondary | ICD-10-CM | POA: Diagnosis not present

## 2020-03-16 DIAGNOSIS — Z13220 Encounter for screening for lipoid disorders: Secondary | ICD-10-CM | POA: Diagnosis not present

## 2020-03-18 DIAGNOSIS — Z23 Encounter for immunization: Secondary | ICD-10-CM | POA: Diagnosis not present

## 2020-03-18 DIAGNOSIS — K743 Primary biliary cirrhosis: Secondary | ICD-10-CM | POA: Diagnosis not present

## 2020-03-18 DIAGNOSIS — Z0000 Encounter for general adult medical examination without abnormal findings: Secondary | ICD-10-CM | POA: Diagnosis not present

## 2020-03-18 DIAGNOSIS — J4530 Mild persistent asthma, uncomplicated: Secondary | ICD-10-CM | POA: Diagnosis not present

## 2020-03-18 DIAGNOSIS — E559 Vitamin D deficiency, unspecified: Secondary | ICD-10-CM | POA: Diagnosis not present

## 2020-03-25 DIAGNOSIS — Z1211 Encounter for screening for malignant neoplasm of colon: Secondary | ICD-10-CM | POA: Diagnosis not present

## 2020-03-25 DIAGNOSIS — K743 Primary biliary cirrhosis: Secondary | ICD-10-CM | POA: Diagnosis not present

## 2020-05-30 ENCOUNTER — Other Ambulatory Visit: Payer: Self-pay | Admitting: Family Medicine

## 2020-05-30 ENCOUNTER — Ambulatory Visit
Admission: RE | Admit: 2020-05-30 | Discharge: 2020-05-30 | Disposition: A | Discharge status: Home / Self Care | Payer: Managed Care, Other (non HMO) | Source: Ambulatory Visit | Attending: Family Medicine | Admitting: Family Medicine

## 2020-05-30 ENCOUNTER — Other Ambulatory Visit: Payer: Self-pay

## 2020-05-30 DIAGNOSIS — Z1231 Encounter for screening mammogram for malignant neoplasm of breast: Secondary | ICD-10-CM

## 2021-02-01 ENCOUNTER — Ambulatory Visit: Payer: Managed Care, Other (non HMO) | Admitting: Podiatry

## 2021-06-21 ENCOUNTER — Ambulatory Visit
Admission: RE | Admit: 2021-06-21 | Discharge: 2021-06-21 | Disposition: A | Discharge status: Home / Self Care | Payer: Managed Care, Other (non HMO) | Source: Ambulatory Visit | Attending: Family Medicine | Admitting: Family Medicine

## 2021-06-21 ENCOUNTER — Other Ambulatory Visit: Payer: Self-pay | Admitting: Family Medicine

## 2021-06-21 DIAGNOSIS — Z1231 Encounter for screening mammogram for malignant neoplasm of breast: Secondary | ICD-10-CM

## 2022-07-24 ENCOUNTER — Ambulatory Visit
Admission: RE | Admit: 2022-07-24 | Discharge: 2022-07-24 | Disposition: A | Discharge status: Home / Self Care | Payer: BC Managed Care – PPO | Source: Ambulatory Visit | Attending: Family Medicine | Admitting: Family Medicine

## 2022-07-24 ENCOUNTER — Other Ambulatory Visit: Payer: Self-pay | Admitting: Family Medicine

## 2022-07-24 DIAGNOSIS — Z1231 Encounter for screening mammogram for malignant neoplasm of breast: Secondary | ICD-10-CM

## 2023-08-26 ENCOUNTER — Other Ambulatory Visit: Payer: Self-pay | Admitting: Family Medicine

## 2023-08-26 ENCOUNTER — Ambulatory Visit
Admission: RE | Admit: 2023-08-26 | Discharge: 2023-08-26 | Disposition: A | Discharge status: Home / Self Care | Payer: 59 | Source: Ambulatory Visit | Attending: Family Medicine | Admitting: Family Medicine

## 2023-08-26 DIAGNOSIS — Z1231 Encounter for screening mammogram for malignant neoplasm of breast: Secondary | ICD-10-CM

## 2023-08-28 ENCOUNTER — Ambulatory Visit: Payer: Self-pay

## 2023-12-12 ENCOUNTER — Other Ambulatory Visit: Payer: Self-pay | Admitting: Chiropractic Medicine

## 2023-12-12 DIAGNOSIS — M51362 Other intervertebral disc degeneration, lumbar region with discogenic back pain and lower extremity pain: Secondary | ICD-10-CM

## 2023-12-12 DIAGNOSIS — M9903 Segmental and somatic dysfunction of lumbar region: Secondary | ICD-10-CM

## 2023-12-13 ENCOUNTER — Encounter: Payer: Self-pay | Admitting: Chiropractic Medicine

## 2023-12-14 ENCOUNTER — Ambulatory Visit
Admission: RE | Admit: 2023-12-14 | Discharge: 2023-12-14 | Disposition: A | Discharge status: Home / Self Care | Source: Ambulatory Visit | Attending: Chiropractic Medicine | Admitting: Chiropractic Medicine

## 2023-12-14 DIAGNOSIS — M51362 Other intervertebral disc degeneration, lumbar region with discogenic back pain and lower extremity pain: Secondary | ICD-10-CM

## 2023-12-14 DIAGNOSIS — M9903 Segmental and somatic dysfunction of lumbar region: Secondary | ICD-10-CM

## 2023-12-17 ENCOUNTER — Other Ambulatory Visit: Payer: Self-pay | Admitting: Family Medicine

## 2023-12-17 ENCOUNTER — Ambulatory Visit
Admission: RE | Admit: 2023-12-17 | Discharge: 2023-12-17 | Disposition: A | Discharge status: Home / Self Care | Source: Ambulatory Visit | Attending: Family Medicine | Admitting: Family Medicine

## 2023-12-17 DIAGNOSIS — N281 Cyst of kidney, acquired: Secondary | ICD-10-CM

## 2023-12-18 ENCOUNTER — Other Ambulatory Visit (HOSPITAL_BASED_OUTPATIENT_CLINIC_OR_DEPARTMENT_OTHER): Payer: Self-pay | Admitting: Family Medicine

## 2023-12-18 DIAGNOSIS — E7849 Other hyperlipidemia: Secondary | ICD-10-CM

## 2023-12-30 ENCOUNTER — Encounter (HOSPITAL_COMMUNITY): Payer: Self-pay

## 2023-12-30 ENCOUNTER — Ambulatory Visit (HOSPITAL_COMMUNITY)
Admission: RE | Admit: 2023-12-30 | Discharge: 2023-12-30 | Disposition: A | Discharge status: Home / Self Care | Payer: Self-pay | Source: Ambulatory Visit | Attending: Family Medicine | Admitting: Family Medicine

## 2023-12-30 DIAGNOSIS — E7849 Other hyperlipidemia: Secondary | ICD-10-CM | POA: Insufficient documentation

## 2024-01-29 ENCOUNTER — Encounter: Payer: Self-pay | Admitting: Internal Medicine

## 2024-01-29 ENCOUNTER — Other Ambulatory Visit: Payer: Self-pay | Admitting: Internal Medicine

## 2024-01-29 ENCOUNTER — Other Ambulatory Visit: Payer: Self-pay

## 2024-01-29 DIAGNOSIS — E279 Disorder of adrenal gland, unspecified: Secondary | ICD-10-CM

## 2024-01-30 ENCOUNTER — Inpatient Hospital Stay: Admission: RE | Admit: 2024-01-30 | Source: Ambulatory Visit

## 2024-01-30 ENCOUNTER — Ambulatory Visit
Admission: RE | Admit: 2024-01-30 | Discharge: 2024-01-30 | Disposition: A | Discharge status: Home / Self Care | Source: Ambulatory Visit | Attending: Internal Medicine | Admitting: Internal Medicine

## 2024-01-30 DIAGNOSIS — E279 Disorder of adrenal gland, unspecified: Secondary | ICD-10-CM

## 2024-01-30 MED ORDER — IOPAMIDOL (ISOVUE-300) INJECTION 61%
100.0000 mL | Freq: Once | INTRAVENOUS | Status: AC | PRN
  Administered 2024-01-30: 100 mL via INTRAVENOUS

## 2024-02-18 ENCOUNTER — Other Ambulatory Visit: Payer: Self-pay | Admitting: Urology

## 2024-02-28 NOTE — Progress Notes (Signed)
 [facility] Pulmonary Medicine Outpatient Clinic   Primary Care Physician: Sari Darral Pay, MD Referring Provider: Pay Sari Darral, *  Dear Dr. Sari Darral Pay, we saw your patient Ms.Samantha Park today in asthma clinic. Below is the documentation for today's visit.  Interval History:   Chief Complaint  Patient presents with  . Asthma    New patient visit    Last Clinic Visit -new patient evaluation for asthma  History of Present Illness:  Samantha Park is an 63 y.o. female who presents for evaluation and management of adult onset mild to moderate persistent allergic persistent asthma around the time of the birth of the child.Samantha Park, is here to establish care in our asthma clinic.  She reports that she had pretty bad allergies as a kid and was followed by Dr. Asa even when she was younger.  Her parents were heavy smokers and she feels this affected her breathing somewhat.  Having said that she was very active and athletic in her high school days and really had no problems with her breathing other than exercise-induced asthma symptoms until she was close to 30 which is around the time that her first son was born in [birth year].  There may be some relationship there.  There is a second child from 1 but she does not recall any changes in her breathing related to that.  She has significant shortness of breath and saw her PCP about this.  They did some spirometry which showed airflow obstruction that was mild in severity.  There was good bronchodilator response of 18%.  She had marked small airways obstructive also.  She was placed on Breo 100 at that time and has just completed about 4 weeks of therapy.  We did PFTs today and they are much improved although she took her Breo last night and they are not pure medication with hold PFTs.    She is hoarse and says that she is frequently like this.  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  When I asked her if she thinks her voice is more hoarse since starting the [employer] she believes that it may be more persistent since then.  She does not particularly like the Breo.  She tried Symbicort before but it made her cough after couple days.  She has not had any true asthma exacerbations although had a virus in March 2025 if not clear that she got steroids.  She does have intermittent sinusitis that requires antibiotics.  She does a lot of throat clearing and coughing at times.  She she saw a gastroenterologist in the past that felt that her cough might be related to GERD and was treated with a PPI and resolved.  It is likely multifactorial.  Her COVID history is that she was initially vaccinated and had COVID about 2 years ago for which she took Paxlovid without problems.  She is scheduled to have surgery on September 12 for 6.4 cm kidney cyst under general anesthesia.  She uses her rescue inhaler really only when she is very short of breath but not as a reliever.  This was purely educational.  We gave her a spacer today because we are going to change her to an HFA inhaler and taught her how to use it.  Our metric for improvement is going to be the walk from the parking lot to her office at school.  General ROS: positive for  - weight gain Allergy  and Immunology ROS: positive for - seasonal allergies ENT ROS: positive for - vocal changes Respiratory ROS: positive for - shortness of breath but not wheezing or cough because I examined her after albuterol challenge negative for - hemoptysis, orthopnea, pleuritic pain, or sputum changes Cardiovascular ROS: negative for - chest pain, edema, irregular heartbeat, or orthopnea  Asthma Medications ICS/LABA (Breo 100 started in July 2025) +  LTRA  (montelukast)   Did not like Symbicort due to cough  Asthma Biologic HX None  Asthma Exacerbations Last exacerbation: March 2025? Last systemic steroids: Unclear Bursts in the past 12  months: 0-1   Asthma Control Test ACT score today is 17  Current Disease Severity Samantha Park has weekly daytime asthma symptoms. She has no nighttime asthma symptoms. The patient is using short-acting beta agonists for symptom control not as often as she should. She has exacerbations requiring oral systemic corticosteroids 0 times per year. Current limitations in activity from asthma: ADL limited especially laundry. Number of days of school or work missed in the last month: None since school just started. Number of urgent/emergent visit in last year: 0  RESEARCH PARTICIPANT: No WHICH PROTOCOL?  n/a  ASTHMA HISTORY: Age of Asthma onset: Probably in her 30s around the time of her pregnancy Intubation HX:  No Hospitalized in past: No ASA sensitivity: No ALLERGIES: Yes   TEST RESULTS AVAILABLE       Prior Allergy SPT Positive or Negative?     Total IgE     Specific IgE     Blood Eosinophils     HgbA1C     Dexa Scan     Serum cortisol random 1.5 in August 2025      Previous Report Reviewed: historical medical records, lab reports, office notes, and x-ray reports   The following portions of the patient's history were reviewed and updated as appropriate: allergies, current medications, past family history, past medical history, past social history, past surgical history, and problem list.  Review of Systems A complete ROS was performed with pertinent positives/negatives noted in the HPI. The remainder of the ROS are negative.   Past History:   Problem List[1] Medical History[2] Family History[3] Social History Social History[4] Social History   Social History Narrative  . Not on file     Medications:  I have documented the patient's current medications to the best of my ability.  See list below (updated today)  Physical Exam:   Vitals:   02/28/24 1056 02/28/24 1104  BP: (!) 156/57 (!) 160/60  Pulse: 84   Resp: 16   Temp: 97.9 F (36.6 C)   TempSrc: Oral   SpO2:  98%   Weight: 84.4 kg (186 lb)     Vitals:  Reviewed, stable, overweight  General appearance: alert, appears stated age, cooperative, and no distress Head: Normocephalic, without obvious abnormality, atraumatic Ears: abnormal external canal right ear - not visualized secondary to cerumen and abnormal TM left ear - serous middle ear fluid Eyes: conjunctiva normal Nose: edema Throat: lips, mucosa, and tongue normal; teeth and gums normal and no thrush Lungs:air entry:  fair , no rales, rhonchi or wheezing, and crackles: L lower posterior that cleared with coughing Heart: regular rate and rhythm Abdomen: Not examined no complaints Extremities: extremities normal, atraumatic, no cyanosis or edema Skin: Skin color, texture, turgor normal. No rashes or lesions Neurologic: Grossly normal    Diagnostic Review:   I personally reviewed the following:  PULMONARY FUNCTION TESTING-the test from her PCP show  mild airflow obstruction with good response of bronchodilator, a significant small airways proportion also.  Testing today on ICS/LABA shows normalization of her spirometry.  Date 01/08/2024 at her PCP Prior to starting Breo 02/28/2024 6 weeks into Breo  Did not withhold her Breo however   FEV1 % pred 83% to 99% (+18%) 116% (no BD)   FVC % pred 100% 116%   FEV1/FVC % 64% to 76% 79%   FEF 25-75% pred 65% to 99% (+53%) 120%   TLC % pred     RV % pred     DLCO uncorr % pred      Methacholine Challenge?No  ECHOCARDIOGRAM/CARDS EVAL   RADIOLOGY    Assessment:   1. SOB (shortness of breath)   2. Mild persistent asthma without complication (CMD)   3. Allergic rhinitis, unspecified seasonality, unspecified trigger   4. Hoarseness or changing voice    Likely mild persistent asthma with an allergic phenotype.  Her shortness of breath seems out of proportion to her pulmonary function tests albeit these are with out a withhold of her Breo.  They are improved compared to her breathing  test at her PCPs office.  I feel that she might benefit from an inhaler with better small airways deposition and the addition of an anticholinergic drug.  Will start her on Breztri 2 puffs twice daily, using the spacer and have her report back to me about her walk to the office metric next week.  I am thinking that her hoarseness is from the DPI Breo and should improve off this inhaler.  Assuming that she is tolerating the [facility] well we will talk about whether or not she should pretreat with albuterol before her walk or not.  MyChart follow-up next week, video follow-up in 2 months, which will be 3 months of ICS therapy.  From a preop evaluation, she has normal lung function and her asthma seems well-controlled at this time.  Her dyspnea on exertion is out of proportion to her pulmonary function and is likely multifactorial including her weight and mild asthma.  She is a reasonable risk for general anesthesia and she just takes her inhalers as prescribed and focus on pulmonary toilet routines.  Plan:   Stop the Breo.  Put it in a Ziploc back and save it in case we need it.  Start Breztri 2 puffs (1 puff at a time, using the spacer, no whistling) twice daily Make sure you wash your mouth out well with water after using the Breztri. Send me a message through MyChart APP next week to let me know what you think of the Breztri.  How is the walk to your office every morning?  Any easier?  Use 2 puffs albuterol inhaler for RELIEF of shortness of breath And Use 2 puffs albuterol inhaler for PROACTIVE PREVENTION of shortness of breath before activities that always make you very winded  Continue montelukast 1 tablet daily for your allergies.  There are no pulmonary restrictions for your surgery only to use your inhalers as prescribed and to perform good incentive spirometry and deep breathing to prevent lung complications from pneumonia.  Electronically Signed by: Sari Elida Ada, MD 02/28/2024 6:13  PM  Asthma Care Coordination: More than 50% of this visit was spent counseling patient about asthma treatments, proper inhaler techniques, action plan personalized for patient. Total time with patient: 60 minutes (45 minutes counseling).    Immunization History  Administered Date(s) Administered  . Influenza,split virus, trivalent, PF 03/10/2019, 03/18/2020, 03/31/2021,  04/13/2022, 06/10/2023  . Pfizer SARS-CoV-2 Primary Series 12+ yrs 09/04/2019, 09/26/2019  . Pneumococcal Conjugate Vaccine 20-Valent (PREVNAR-20) 6 wks+ 06/10/2023  . Pneumococcal Polysaccharide Vaccine, 23 Valent (PNEUMOVAX-23) 2Y+ 02/02/2015  . TDAP VACCINE (BOOSTRIX,ADACEL) 7Y+ 01/29/2014  . Varicella Zoster [facility]) 18Y+ 03/31/2021, 10/05/2021  . Zoster, Live 03/31/2021, 10/05/2021    Current Rx ordered in [facility][5]         [1] Patient Active Problem List Diagnosis  . Atrophic vaginitis  . Benign neoplasm of adrenal gland  . Body mass index (BMI) 45.0-49.9, adult (CMD)  . Essential hypertension  . Low back pain  . Primary biliary cholangitis    (CMD)  . Vitamin D deficiency  . Pure hypercholesterolemia  . Prediabetes  [2] Past Medical History: Diagnosis Date  . Allergy   . Asthma (CMD)   . Depression    single episode  . Hypertension   . Liver disease   [3] No family history on file. [4] Social History Tobacco Use  . Smoking status: Never    Passive exposure: Never  . Smokeless tobacco: Never  Substance Use Topics  . Alcohol use: Not Currently  . Drug use: Never  [5] Meds Ordered in [facility]  Medication Sig Dispense Refill  . albuterol HFA (PROVENTIL HFA;VENTOLIN HFA;PROAIR HFA) 90 mcg/actuation inhaler Inhale 2 puffs every 6 (six) hours as needed for wheezing.    . cetirizine (ZyrTEC) 10 mg tablet Take 10 mg by mouth daily.    . cholecalciferol (VITAMIN D3) 2,000 unit tablet Take 1 tablet by mouth daily.    . montelukast (SINGULAIR) 10 mg tablet     . ursodioL (ACTIGALL) 500 mg  tablet     . budesonide-glycopyr-formoterol (BREZTRI AREOSPHERE) 160-9-4.8 mcg/actuation inhaler Inhale 2 puffs 2 (two) times a day. 10.7 g 11   No current Epic-ordered facility-administered medications on file.

## 2024-03-02 NOTE — Unmapped External Note (Signed)
 Assessment Note   Demographics Verification - Call Monitoring - Confidentiality      Member Verification: Member Verification    Call Monitoring Disclaimer: Call Monitoring Disclaimer         Person Providing Info on the Call   Who is the person providing the information on the call? Member      Limitations/Preferences   What, if any, physical limitations, health literacy, language needs and learning preferences do you have that I should be aware of when we talk? Language- No Limitations      Specify other language limitations        Specify other physical limitations        [facility] Contact Type   [facility] contact type: 3rd contact-Telephonic Chronic Condition     New Weight Management Adult                          General Health Perception   How would you describe your health in general?        SMART GOAL & SMART GOAL Follow Up     SMART GOAL    Did the member set a SMART goal? No  Did the member meet their previous SMART goal?  No goal set      Brief Summary of Member Status   What medical conditions has your doctor diagnosed you with?    Past Medical History:  Diagnosis Date  . Asthma in adult (HHS/HCC)   . Chronic neck and back pain   . Hypertension   . Menopause   . Osteoarthritis      Did the member score via [employer] Engine for any condition(s) that he/she did NOT confirm?    HYW:  What medical conditions are you most concerned about and why?    In between MD visits people often find a need to go to the ER or an Urgent Care facility.can you tell me if you had any concerns or issues in which you went to an ER or Urgent care in the last 12 months?      Condition Specific Metrics   Height/Weight/BMI  / /    Blood Pressure    Outcome Metrics  -     Do you have an asthma action plan that  you and your provider reviewed within the past 12 months?  Yes         Medications    Current Outpatient  Medications:  .  albuterol (VENTOLIN HFA) 90 mcg/actuation inhaler, Inhale 2 puffs every 6 (six) hours as needed for wheezing, Disp: , Rfl:  .  cetirizine (ZyrTEC) 10 MG chewable tablet, Take 10 mg by mouth daily, Disp: , Rfl:  .  cholecalciferol, vitamin D3, (D3-2000 ORAL), Take by mouth, Disp: , Rfl:  .  estradioL (ESTRACE) 0.01 % (0.1 mg/gram) vaginal cream, Insert 2 g into the vagina daily Indications: postmenopausal urethral atrophy, Disp: , Rfl:  .  fluticasone furoate-vilanteroL (Breo Ellipta) 100-25 mcg/dose dsdv, Inhale daily, Disp: , Rfl:  .  hydrOXYzine (ATARAX) 25 MG tablet, Take 25 mg by mouth 3 (three) times a day as needed for itching, Disp: , Rfl:  .  ketoconazole (NIZORAL) 2 % cream, Apply topically daily, Disp: , Rfl:  .  metoprolol-hydrochlorothiazide (LOPRESSOR HCT) 100-25 mg tablet, Take 1 tablet by mouth daily, Disp: , Rfl:  .  montelukast (SINGULAIR) 10 mg tablet, Take 10 mg by mouth nightly, Disp: , Rfl:  .  pantoprazole (PROTONIX) 20 MG tablet, Take 40 mg by mouth daily, Disp: , Rfl:  .  TiZANidine (ZANAFLEX) 4 MG capsule, Take 4 mg by mouth 3 (three) times a day as needed for muscle spasms Indications: muscle spasms caused by a spinal disease, Disp: , Rfl:  .  triamcinolone  (KENALOG ) 0.1 % cream, Apply topically 2 (two) times a day, Disp: , Rfl:  .  ursodioL (ACTIGALL) 500 MG tablet, Take 500 mg by mouth 3 (three) times a day, Disp: , Rfl:     There are no discontinued medications.     Medication Review         Depression Screening PHQ2/PHQ9 Exclusions   PHQ2 EXCLUSION CRITERIA:  Do NOT administer the PHQ-2 if any of the following apply to this member.   PHQ9 EXCLUSION CRITERIA: To determine if this assessment is right for you, I need to ask you some questions before we start.  Besides depression, have you been diagnosed with any other mental health issues such as: No applicable exclusions              Depression PHQ2/PHQ9 Screening Results      PHQ2  The PHQ-2 questions are scored from 0 (not at all) to 3 (nearly every day) and then added together: Score 0-2 = Not likely to be at risk for depression, Score 3-6 = At risk for depression (further screening recommended).        PHQ9 PHQ-9 Mini Module-If diagnosis of depression- show confirmed dx of depression and show PHQ-9 score & score description        Social Drivers of Health   Tobacco Use: Low Risk  (02/28/2024)   Received from [facility]   Patient History   . Smoking Tobacco Use: Never   . Smokeless Tobacco Use: Never   . Passive Exposure: Never      Lab Results   Results     There are no results available from this visit.         Immunizations   Immunizations Mini Module (show all Q/A pairs answered during this encounter)       Intervention/Actions   Education Topics Discussed -     Document the weight management adult education and interventions provided on this call:  Stress management          Referrals & Referral Follow Up   Referrals    Flowsheet Row Most Recent Value  Please document any referrals to other programs or resources that were made by the staff: None During This Contact            MEP/Mobile App Registration & Education on Tools/Resources   Is the member registered on the Member Engagement Platform (MEP) or Mobile App?  Explain tools and resources available on Member Engagement Platform [facility]) and how to get the most benefit out of them.  Indicate resources reviewed with member: Yes-registered on [facility] 01/01/2024    Messaging with coach    Next Scheduled Appointment      Date Provider Department Visit Type   05/18/2024 6:30 PM Erla Karlynn Raddle. Care Management [facility] Nurse Follow Up Assessment- 1st Attempt         Follow Up Needs Identified   Conditions Confirmed: Diabetes, HTN, Asthma, Chronic Lower Back Pain, OA (knees and lower back), Menopause, Primary biliary cholangitis, Cyst on left  kidney, GERD.   HT:  5'6"  WT:  263 lbs.  BMI: 42.5  Member Stated: Diagnosed as a diabetic. Will have surgery  on September 12th, 2025 - 6.4 cm cyst on left kidney.   Cyst creating obstruction resulting in kidney stones.  Husband had surgery recently, member started work today, further stated she has had a stressful last several months.   Member was concerned about her breathing.  Recently had a new spirometer.  Was recently seen by new pulmonologist.  Started on [facility]. Has improvement with breathing, but began to have hoarseness, nausea (gagging reflex when rinsing mouth).  Doctor recommended inhaler (unspecified) instead of Breo-Ellipta.  Has a follow up appointment with pulmonologist. PCP and pulmonologist stated lungs were clear.  Pulmonologist stated shortness may be due to bronchioles swelling. Member stated she is still wanting to focus on weight loss.  Believes it will improve overall health and ability to exercise.  Saw sports doctor that performs knee replacement, recently had gel injections to both knees. Right knee improvement with gel injections.  Left knee has not improved as much as right knee.  Entire conversation focused on member's concerns and updates.  Discussion limited, Did not get to address pre/post visit or all assessment questions, SMART Goals, medication review, A1C.  Member -centric conversation focused on member's concerns and needs.   Education: Weight Management benefits can include managing chronic conditions.  Managing weight can help take pressure off from joints and lower back, make you feel more energetic, can increase physical activity.  Symptoms of stress are broadly categorized into physical, emotional, and behavioral changes. Physical symptoms can include headaches, muscle tension, fatigue, shortness of breath, and digestive issues, while emotional symptoms may manifest as anxiety, irritability, and feeling overwhelmed.  Stress can trigger hormones that  affect your diabetes, hypertension and make it harder to manage weight.   Stress management techniques can include: deep breathing, guided imagery, prayer, meditation, talk therapy, music therapy.  Healthwise: Diabetes Care Plan   POC: SMART Goal, Med Review, Kidney Cyst surgical removal (03/20/24),  DM (A1C)/HTN/Asthma/OA status, Weight Management status.  ED: Diabetes Follow Up Care (eyes/feet/UA/dental).

## 2024-03-03 NOTE — Patient Instructions (Signed)
 SURGICAL WAITING ROOM VISITATION  Patients having surgery or a procedure may have no more than 2 support people in the waiting area - these visitors may rotate.    Children under the age of 72 must have an adult with them who is not the patient.  Visitors with respiratory illnesses are discouraged from visiting and should remain at home.  If the patient needs to stay at the hospital during part of their recovery, the visitor guidelines for inpatient rooms apply. Pre-op nurse will coordinate an appropriate time for 1 support person to accompany patient in pre-op.  This support person may not rotate.    Please refer to the [facility] website for the visitor guidelines for Inpatients (after your surgery is over and you are in a regular room).       Your procedure is scheduled on: 03/20/24   Report to [facility] Main Entrance    Report to admitting at: 8:45 AM   Call this number if you have problems the morning of surgery [phone]   Do not eat food or drink fluids :After Midnight.  FOLLOW BOWEL PREP AND ANY ADDITIONAL PRE OP INSTRUCTIONS YOU RECEIVED FROM YOUR SURGEON'S OFFICE!!!   Oral Hygiene is also important to reduce your risk of infection.                                    Remember - BRUSH YOUR TEETH THE MORNING OF SURGERY WITH YOUR REGULAR TOOTHPASTE  DENTURES WILL BE REMOVED PRIOR TO SURGERY PLEASE DO NOT APPLY Poly grip OR ADHESIVES!!!   Do NOT smoke after Midnight   Stop all vitamins and herbal supplements 7 days before surgery.   Take these medicines the morning of surgery with A SIP OF WATER: montelukast,cetirizine.Use inhalers as usual and bring them.                  You may not have any metal on your body including hair pins, jewelry, and body piercing             Do not wear make-up, lotions, powders, perfumes/cologne, or deodorant  Do not wear nail polish including gel and S&S, artificial/acrylic nails, or any other type of covering on natural  nails including finger and toenails. If you have artificial nails, gel coating, etc. that needs to be removed by a nail salon please have this removed prior to surgery or surgery may need to be canceled/ delayed if the surgeon/ anesthesia feels like they are unable to be safely monitored.   Do not shave  48 hours prior to surgery.    Do not bring valuables to the hospital. [facility] IS NOT             RESPONSIBLE   FOR VALUABLES.   Contacts, glasses, dentures or bridgework may not be worn into surgery.   Bring small overnight bag day of surgery.   DO NOT BRING YOUR HOME MEDICATIONS TO THE HOSPITAL. PHARMACY WILL DISPENSE MEDICATIONS LISTED ON YOUR MEDICATION LIST TO YOU DURING YOUR ADMISSION IN THE HOSPITAL!    Patients discharged on the day of surgery will not be allowed to drive home.  Someone NEEDS to stay with you for the first 24 hours after anesthesia.   Special Instructions: Bring a copy of your healthcare power of attorney and living will documents the day of surgery if you haven't scanned them before.  Please read over the following fact sheets you were given: IF YOU HAVE QUESTIONS ABOUT YOUR PRE-OP INSTRUCTIONS PLEASE CALL [phone].  If you received a COVID test during your pre-op visit  it is requested that you wear a mask when out in public, stay away from anyone that may not be feeling well and notify your surgeon if you develop symptoms. If you test positive for Covid or have been in contact with anyone that has tested positive in the last 10 days please notify you surgeon.  [facility] - Preparing for Surgery Before surgery, you can play an important role.  Because skin is not sterile, your skin needs to be as free of germs as possible.  You can reduce the number of germs on your skin by washing with CHG (chlorahexidine gluconate) soap before surgery.  CHG is an antiseptic cleaner which kills germs and bonds with the skin to continue killing germs even after  washing. Please DO NOT use if you have an allergy to CHG or antibacterial soaps.  If your skin becomes reddened/irritated stop using the CHG and inform your nurse when you arrive at Short Stay. Do not shave (including legs and underarms) for at least 48 hours prior to the first CHG shower.  You may shave your face/neck. Please follow these instructions carefully:  1.  Shower with CHG Soap the night before surgery and the  morning of Surgery.  2.  If you choose to wash your hair, wash your hair first as usual with your  normal  shampoo.  3.  After you shampoo, rinse your hair and body thoroughly to remove the  shampoo.                           4.  Use CHG as you would any other liquid soap.  You can apply chg directly  to the skin and wash                       Gently with a scrungie or clean washcloth.  5.  Apply the CHG Soap to your body ONLY FROM THE NECK DOWN.   Do not use on face/ open                           Wound or open sores. Avoid contact with eyes, ears mouth and genitals (private parts).                       Wash face,  Genitals (private parts) with your normal soap.             6.  Wash thoroughly, paying special attention to the area where your surgery  will be performed.  7.  Thoroughly rinse your body with warm water from the neck down.  8.  DO NOT shower/wash with your normal soap after using and rinsing off  the CHG Soap.                9.  Pat yourself dry with a clean towel.            10.  Wear clean pajamas.            11.  Place clean sheets on your bed the night of your first shower and do not  sleep with pets. Day of Surgery : Do not apply any lotions/deodorants the morning  of surgery.  Please wear clean clothes to the hospital/surgery center.  FAILURE TO FOLLOW THESE INSTRUCTIONS MAY RESULT IN THE CANCELLATION OF YOUR SURGERY PATIENT SIGNATURE_________________________________  NURSE  SIGNATURE__________________________________  ________________________________________________________________________   WHAT IS A BLOOD TRANSFUSION? Blood Transfusion Information  A transfusion is the replacement of blood or some of its parts. Blood is made up of multiple cells which provide different functions. Red blood cells carry oxygen and are used for blood loss replacement. White blood cells fight against infection. Platelets control bleeding. Plasma helps clot blood. Other blood products are available for specialized needs, such as hemophilia or other clotting disorders. BEFORE THE TRANSFUSION  Who gives blood for transfusions?  Healthy volunteers who are fully evaluated to make sure their blood is safe. This is blood bank blood. Transfusion therapy is the safest it has ever been in the practice of medicine. Before blood is taken from a donor, a complete history is taken to make sure that person has no history of diseases nor engages in risky social behavior (examples are intravenous drug use or sexual activity with multiple partners). The donor's travel history is screened to minimize risk of transmitting infections, such as malaria. The donated blood is tested for signs of infectious diseases, such as HIV and hepatitis. The blood is then tested to be sure it is compatible with you in order to minimize the chance of a transfusion reaction. If you or a relative donates blood, this is often done in anticipation of surgery and is not appropriate for emergency situations. It takes many days to process the donated blood. RISKS AND COMPLICATIONS Although transfusion therapy is very safe and saves many lives, the main dangers of transfusion include:  Getting an infectious disease. Developing a transfusion reaction. This is an allergic reaction to something in the blood you were given. Every precaution is taken to prevent this. The decision to have a blood transfusion has been considered  carefully by your caregiver before blood is given. Blood is not given unless the benefits outweigh the risks. AFTER THE TRANSFUSION Right after receiving a blood transfusion, you will usually feel much better and more energetic. This is especially true if your red blood cells have gotten low (anemic). The transfusion raises the level of the red blood cells which carry oxygen, and this usually causes an energy increase. The nurse administering the transfusion will monitor you carefully for complications. HOME CARE INSTRUCTIONS  No special instructions are needed after a transfusion. You may find your energy is better. Speak with your caregiver about any limitations on activity for underlying diseases you may have. SEEK MEDICAL CARE IF:  Your condition is not improving after your transfusion. You develop redness or irritation at the intravenous (IV) site. SEEK IMMEDIATE MEDICAL CARE IF:  Any of the following symptoms occur over the next 12 hours: Shaking chills. You have a temperature by mouth above 102 F (38.9 C), not controlled by medicine. Chest, back, or muscle pain. People around you feel you are not acting correctly or are confused. Shortness of breath or difficulty breathing. Dizziness and fainting. You get a rash or develop hives. You have a decrease in urine output. Your urine turns a dark color or changes to pink, red, or brown. Any of the following symptoms occur over the next 10 days: You have a temperature by mouth above 102 F (38.9 C), not controlled by medicine. Shortness of breath. Weakness after normal activity. The white part of the eye turns yellow (jaundice). You have a  decrease in the amount of urine or are urinating less often. Your urine turns a dark color or changes to pink, red, or brown. Document Released: 06/22/2000 Document Revised: 09/17/2011 Document Reviewed: 02/09/2008 [facility] Patient Information 2014 [place],  [place].  _______________________________________________________________________

## 2024-03-04 ENCOUNTER — Encounter (HOSPITAL_COMMUNITY): Payer: Self-pay

## 2024-03-04 ENCOUNTER — Encounter (HOSPITAL_COMMUNITY)
Admission: RE | Admit: 2024-03-04 | Discharge: 2024-03-04 | Disposition: A | Discharge status: Home / Self Care | Source: Ambulatory Visit | Attending: Urology | Admitting: Urology

## 2024-03-04 ENCOUNTER — Other Ambulatory Visit: Payer: Self-pay

## 2024-03-04 VITALS — BP 145/64 | HR 72 | Temp 97.8°F | Ht 66.0 in | Wt 284.0 lb

## 2024-03-04 DIAGNOSIS — Z01810 Encounter for preprocedural cardiovascular examination: Secondary | ICD-10-CM | POA: Diagnosis present

## 2024-03-04 DIAGNOSIS — J45909 Unspecified asthma, uncomplicated: Secondary | ICD-10-CM | POA: Diagnosis not present

## 2024-03-04 DIAGNOSIS — N281 Cyst of kidney, acquired: Secondary | ICD-10-CM | POA: Diagnosis not present

## 2024-03-04 DIAGNOSIS — K7460 Unspecified cirrhosis of liver: Secondary | ICD-10-CM | POA: Insufficient documentation

## 2024-03-04 DIAGNOSIS — Z01812 Encounter for preprocedural laboratory examination: Secondary | ICD-10-CM | POA: Diagnosis present

## 2024-03-04 DIAGNOSIS — N130 Hydronephrosis with ureteropelvic junction obstruction: Secondary | ICD-10-CM | POA: Diagnosis not present

## 2024-03-04 DIAGNOSIS — I10 Essential (primary) hypertension: Secondary | ICD-10-CM | POA: Insufficient documentation

## 2024-03-04 DIAGNOSIS — Z01818 Encounter for other preprocedural examination: Secondary | ICD-10-CM | POA: Diagnosis not present

## 2024-03-04 DIAGNOSIS — E119 Type 2 diabetes mellitus without complications: Secondary | ICD-10-CM | POA: Diagnosis not present

## 2024-03-04 DIAGNOSIS — M1990 Unspecified osteoarthritis, unspecified site: Secondary | ICD-10-CM | POA: Insufficient documentation

## 2024-03-04 DIAGNOSIS — Z6841 Body Mass Index (BMI) 40.0 and over, adult: Secondary | ICD-10-CM | POA: Diagnosis not present

## 2024-03-04 DIAGNOSIS — E669 Obesity, unspecified: Secondary | ICD-10-CM | POA: Insufficient documentation

## 2024-03-04 HISTORY — DX: Type 2 diabetes mellitus without complications: E11.9

## 2024-03-04 HISTORY — DX: Personal history of urinary calculi: Z87.442

## 2024-03-04 HISTORY — DX: Dyspnea, unspecified: R06.00

## 2024-03-04 LAB — CBC
HCT: 39.1 % (ref 36.0–46.0)
Hemoglobin: 12.7 g/dL (ref 12.0–15.0)
MCH: 30.3 pg (ref 26.0–34.0)
MCHC: 32.5 g/dL (ref 30.0–36.0)
MCV: 93.3 fL (ref 80.0–100.0)
Platelets: 225 K/uL (ref 150–400)
RBC: 4.19 MIL/uL (ref 3.87–5.11)
RDW: 13 % (ref 11.5–15.5)
WBC: 6 K/uL (ref 4.0–10.5)
nRBC: 0 % (ref 0.0–0.2)

## 2024-03-04 LAB — BASIC METABOLIC PANEL WITH GFR
Anion gap: 12 (ref 5–15)
BUN: 18 mg/dL (ref 8–23)
CO2: 25 mmol/L (ref 22–32)
Calcium: 10.1 mg/dL (ref 8.9–10.3)
Chloride: 101 mmol/L (ref 98–111)
Creatinine, Ser: 0.93 mg/dL (ref 0.44–1.00)
GFR, Estimated: >60 mL/min (ref >60)
Glucose, Bld: 120 mg/dL — ABNORMAL HIGH (ref 70–99)
Potassium: 4.3 mmol/L (ref 3.5–5.1)
Sodium: 139 mmol/L (ref 135–145)

## 2024-03-04 LAB — TYPE AND SCREEN
ABO/RH(D): O POS
Antibody Screen: NEGATIVE

## 2024-03-04 LAB — GLUCOSE, CAPILLARY: Glucose-Capillary: 143 mg/dL — ABNORMAL HIGH (ref 70–99)

## 2024-03-04 NOTE — Progress Notes (Addendum)
 For Anesthesia: PCP - Aisha Harvey, MD  Cardiologist - N/A Pulmonologist: Georgina Harvey Barnacle MD. Bowel Prep reminder:  Chest x-ray - CT cardiac: 01/05/24 EKG - 03/04/24 Stress Test -  ECHO -  Cardiac Cath -  Pacemaker/ICD device last checked: Pacemaker orders received: Device Rep notified:  Spinal Cord Stimulator:N/A  Sleep Study - Yes CPAP - NO  Fasting Blood Sugar - N/A Checks Blood Sugar ___0__ times a day Date and result of last Hgb A1c-  Last dose of GLP1 agonist- N/A GLP1 instructions:   Last dose of SGLT-2 inhibitors- N/A SGLT-2 instructions:   Blood Thinner Instructions: Aspirin Instructions: Last Dose:  Activity level: Can't  go up a flight of stairs and activities of daily living without stopping and without shortness of breath   Unable to exercise without shortness of breath  Anesthesia review: Hx: HTN,New DIA diagnose,SOB  Patient denies shortness of breath, fever, cough and chest pain at PAT appointment   Patient verbalized understanding of instructions that were reviewed over the telephone.

## 2024-03-05 ENCOUNTER — Encounter (HOSPITAL_COMMUNITY): Payer: Self-pay

## 2024-03-05 LAB — HEMOGLOBIN A1C
Hgb A1c MFr Bld: 6 % — ABNORMAL HIGH (ref 4.8–5.6)
Mean Plasma Glucose: 126 mg/dL

## 2024-03-05 NOTE — Progress Notes (Signed)
 Case: [id number] Date/Time: 03/20/24 1045   Procedures:      PYELOPLASTY, ROBOT-ASSISTED (Left)     DECORTICATION, CYST, KIDNEY, ROBOT-ASSISTED (Left)     CYSTOSCOPY, FLEXIBLE, WITH STENT REPLACEMENT (Left)     CYSTOSCOPY, WITH RETROGRADE PYELOGRAM (Left)   Anesthesia type: General   Diagnosis:      Renal cyst [N28.1]     Acquired hydronephrosis due to obstruction of ureteropelvic junction (UPJ) [N13.0]   Pre-op diagnosis: LEFT RENAL CYST, URETEROPELVIC JUNCTION OBSTRUCTION   Location[facility] ROOM 03 / [facility]   Surgeons: Alvaro Ricardo KATHEE Mickey., MD       DISCUSSION: Samantha Park is a 63 yo female with PMH of HTN, asthma, DM, cirrhosis (by CT), arthritis, obesity (BMI 45).  Prior anesthesia complications include PONV, prolonged emergence.   Seen in asthma clinic on 02/28/24. Mild by PFTs. Risk assessment given:  From a preop evaluation, she has normal lung function and her asthma seems well-controlled at this time.  Her dyspnea on exertion is out of proportion to her pulmonary function and is likely multifactorial including her weight and mild asthma.  She is a reasonable risk for general anesthesia and she just takes her inhalers as prescribed and focus on pulmonary toilet routines.   VS: BP (!) 145/64   Pulse 72   Temp 36.6 C (Oral)   Ht 5' 6 (1.676 m)   Wt 128.8 kg   SpO2 96%   BMI 45.84 kg/m   PROVIDERS: Aisha Harvey, MD   LABS: Labs reviewed: Acceptable for surgery. (all labs ordered are listed, but only abnormal results are displayed)  Labs Reviewed  BASIC METABOLIC PANEL WITH GFR - Abnormal; Notable for the following components:      Result Value   Glucose, Bld 120 (*)    All other components within normal limits  GLUCOSE, CAPILLARY - Abnormal; Notable for the following components:   Glucose-Capillary 143 (*)    All other components within normal limits  HEMOGLOBIN A1C - Abnormal; Notable for the following components:   Hgb A1c MFr Bld 6.0 (*)    All other  components within normal limits  CBC  TYPE AND SCREEN     IMAGES: CT Abdomen 01/30/24:  IMPRESSION: Stable moderate left renal pelvicaliectasis, without dilatation or contrast opacification of the left ureter. This remains suspicious for a left UPJ obstruction.   Large left renal sinus cyst.   Nonobstructing calculi in lower pole of left kidney.   Findings suspicious for cirrhosis. Stable mild lymphadenopathy in the porta hepatis and gastrohepatic ligament, likely reactive/secondary to cirrhosis.   EKG 03/04/24:  Sinus bradycardia, rate 58 Otherwise normal ECG  CV:  Past Medical History:  Diagnosis Date   Anemia    yrs ago   Arthritis    Asthma    Complication of anesthesia    trouble waking up after knee surgery 2014, limited rom left shoulder   Cyst of left kidney    Diabetes mellitus without complication (HCC)    Dyspnea    Elevated liver enzymes 01/2018   not current problem per pt, no liver problems noted with testing done   Flu 1-3-0-2020   recovered now, spouse had flu took tamiflu   History of kidney stones    Hypertension    PONV (postoperative nausea and vomiting)    Shoulder fracture 06/18/2018   left in physical therpay now   Vitamin D deficiency    hx of    Past Surgical History:  Procedure Laterality Date  BACK SURGERY  2016   cervical spine fusion   CYSTOSCOPY/RETROGRADE/URETEROSCOPY Left 08/29/2018   Procedure: CYSTOSCOPY/RETROGRADE/left URETEROSCOPY;  Surgeon: Alvaro Hummer, MD;  Location: [facility];  Service: Urology;  Laterality: Left;   KNEE ARTHROSCOPY Left july 2011   KNEE ARTHROSCOPY WITH MEDIAL MENISECTOMY Right 12/31/2012   Procedure: RIGHT KNEE ARTHROSCOPY WITH MEDIAL MENISECTOMY;  Surgeon: Tanda DELENA Heading, MD;  Location[facility];  Service: Orthopedics;  Laterality: Right;   NASAL SEPTUM SURGERY  1984   TOENAIL EXCISION Left 1981   big toe    MEDICATIONS:  BREZTRI AEROSPHERE 160-9-4.8 MCG/ACT AERO inhaler    cetirizine (ZYRTEC) 10 MG tablet   Cholecalciferol (VITAMIN D3) 2000 units TABS   estradiol (ESTRACE) 0.1 MG/GM vaginal cream   hydrOXYzine (ATARAX) 25 MG tablet   ketoconazole (NIZORAL) 2 % cream   losartan-hydrochlorothiazide (HYZAAR) 100-12.5 MG tablet   montelukast (SINGULAIR) 10 MG tablet   Polyethyl Glycol-Propyl Glycol (SYSTANE) 0.4-0.3 % GEL ophthalmic gel   tiZANidine (ZANAFLEX) 4 MG capsule   triamcinolone  cream (KENALOG ) 0.1 %   ursodiol (ACTIGALL) 500 MG tablet   VENTOLIN HFA 108 (90 Base) MCG/ACT inhaler   No current facility-administered medications for this encounter.    Burnard CHRISTELLA Odis DEVONNA [facility] Short Stay/Anesthesiology [facility] Phone [phone] 03/05/2024 3:12 PM

## 2024-03-05 NOTE — Anesthesia Preprocedure Evaluation (Addendum)
 Anesthesia Evaluation  Patient identified by MRN, date of birth, ID band Patient awake    Reviewed: Allergy & Precautions, NPO status , Patient's Chart, lab work & pertinent test results  History of Anesthesia Complications (+) PONV, PROLONGED EMERGENCE and history of anesthetic complications  Airway Mallampati: II  TM Distance: >3 FB Neck ROM: Full    Dental no notable dental hx. (+) Teeth Intact, Dental Advisory Given   Pulmonary shortness of breath, asthma    Pulmonary exam normal breath sounds clear to auscultation       Cardiovascular hypertension, Pt. on medications Normal cardiovascular exam Rhythm:Regular Rate:Normal     Neuro/Psych negative neurological ROS  negative psych ROS   GI/Hepatic negative GI ROS, Neg liver ROS,,,  Endo/Other  diabetes    Renal/GU Renal diseasenegative Renal ROS     Musculoskeletal  (+) Arthritis ,    Abdominal  (+) + obese  Peds  Hematology negative hematology ROS (+) Blood dyscrasia, anemia   Anesthesia Other Findings LEFT HYDRONEPHROSIS  Reproductive/Obstetrics                              Anesthesia Physical Anesthesia Plan  ASA: 3  Anesthesia Plan: General   Post-op Pain Management: Dilaudid  IV, Tylenol  PO (pre-op)* and Celebrex  PO (pre-op)*   Induction: Intravenous  PONV Risk Score and Plan: 4 or greater and Ondansetron , Treatment may vary due to age or medical condition, Dexamethasone , TIVA and Scopolamine patch - Pre-op  Airway Management Planned: Oral ETT  Additional Equipment: None  Intra-op Plan:   Post-operative Plan: Extubation in OR  Informed Consent: I have reviewed the patients History and Physical, chart, labs and discussed the procedure including the risks, benefits and alternatives for the proposed anesthesia with the patient or authorized representative who has indicated his/her understanding and acceptance.      Dental advisory given  Plan Discussed with: CRNA and Anesthesiologist  Anesthesia Plan Comments: (See PAT note from 8/27  DISCUSSION: Samantha Park is a 63 yo female with PMH of HTN, asthma, DM, cirrhosis (by CT), arthritis, obesity (BMI 45).   Prior anesthesia complications include PONV, prolonged emergence.    Seen in asthma clinic on 02/28/24. Mild by PFTs. Risk assessment given:   From a preop evaluation, she has normal lung function and her asthma seems well-controlled at this time.  Her dyspnea on exertion is out of proportion to her pulmonary function and is likely multifactorial including her weight and mild asthma.  She is a reasonable risk for general anesthesia and she just takes her inhalers as prescribed and focus on pulmonary toilet routines.   )         Anesthesia Quick Evaluation

## 2024-03-06 ENCOUNTER — Encounter (HOSPITAL_COMMUNITY): Admission: RE | Admit: 2024-03-06 | Source: Ambulatory Visit

## 2024-03-20 ENCOUNTER — Encounter (HOSPITAL_COMMUNITY): Payer: Self-pay | Admitting: Urology

## 2024-03-20 ENCOUNTER — Encounter (HOSPITAL_COMMUNITY)
Admission: RE | Disposition: A | Discharge status: Home / Self Care | Payer: Self-pay | Source: Home / Self Care | Attending: Urology

## 2024-03-20 ENCOUNTER — Inpatient Hospital Stay (HOSPITAL_COMMUNITY)
Admission: RE | Admit: 2024-03-20 | Discharge: 2024-03-25 | DRG: 659 | Disposition: A | Discharge status: Home / Self Care | Attending: Urology | Admitting: Urology

## 2024-03-20 ENCOUNTER — Ambulatory Visit (HOSPITAL_COMMUNITY)

## 2024-03-20 ENCOUNTER — Ambulatory Visit (HOSPITAL_COMMUNITY): Payer: Self-pay | Admitting: Medical

## 2024-03-20 ENCOUNTER — Ambulatory Visit (HOSPITAL_COMMUNITY): Payer: Self-pay | Admitting: Anesthesiology

## 2024-03-20 ENCOUNTER — Other Ambulatory Visit: Payer: Self-pay

## 2024-03-20 ENCOUNTER — Other Ambulatory Visit (HOSPITAL_COMMUNITY): Payer: Self-pay

## 2024-03-20 DIAGNOSIS — E119 Type 2 diabetes mellitus without complications: Secondary | ICD-10-CM | POA: Diagnosis present

## 2024-03-20 DIAGNOSIS — M1990 Unspecified osteoarthritis, unspecified site: Secondary | ICD-10-CM | POA: Diagnosis present

## 2024-03-20 DIAGNOSIS — R911 Solitary pulmonary nodule: Secondary | ICD-10-CM | POA: Diagnosis present

## 2024-03-20 DIAGNOSIS — N281 Cyst of kidney, acquired: Secondary | ICD-10-CM

## 2024-03-20 DIAGNOSIS — Z981 Arthrodesis status: Secondary | ICD-10-CM

## 2024-03-20 DIAGNOSIS — I10 Essential (primary) hypertension: Secondary | ICD-10-CM | POA: Diagnosis not present

## 2024-03-20 DIAGNOSIS — J189 Pneumonia, unspecified organism: Secondary | ICD-10-CM | POA: Diagnosis not present

## 2024-03-20 DIAGNOSIS — E785 Hyperlipidemia, unspecified: Secondary | ICD-10-CM | POA: Diagnosis present

## 2024-03-20 DIAGNOSIS — N132 Hydronephrosis with renal and ureteral calculous obstruction: Principal | ICD-10-CM | POA: Diagnosis present

## 2024-03-20 DIAGNOSIS — R002 Palpitations: Secondary | ICD-10-CM | POA: Diagnosis not present

## 2024-03-20 DIAGNOSIS — I959 Hypotension, unspecified: Secondary | ICD-10-CM | POA: Diagnosis not present

## 2024-03-20 DIAGNOSIS — J45909 Unspecified asthma, uncomplicated: Secondary | ICD-10-CM | POA: Diagnosis present

## 2024-03-20 DIAGNOSIS — N135 Crossing vessel and stricture of ureter without hydronephrosis: Principal | ICD-10-CM | POA: Diagnosis present

## 2024-03-20 DIAGNOSIS — Z01818 Encounter for other preprocedural examination: Secondary | ICD-10-CM

## 2024-03-20 DIAGNOSIS — I493 Ventricular premature depolarization: Secondary | ICD-10-CM | POA: Diagnosis not present

## 2024-03-20 DIAGNOSIS — R001 Bradycardia, unspecified: Secondary | ICD-10-CM | POA: Diagnosis not present

## 2024-03-20 DIAGNOSIS — M79604 Pain in right leg: Secondary | ICD-10-CM | POA: Diagnosis not present

## 2024-03-20 DIAGNOSIS — R7989 Other specified abnormal findings of blood chemistry: Secondary | ICD-10-CM | POA: Diagnosis not present

## 2024-03-20 DIAGNOSIS — Z888 Allergy status to other drugs, medicaments and biological substances status: Secondary | ICD-10-CM

## 2024-03-20 DIAGNOSIS — R310 Gross hematuria: Secondary | ICD-10-CM | POA: Diagnosis not present

## 2024-03-20 DIAGNOSIS — Z885 Allergy status to narcotic agent status: Secondary | ICD-10-CM

## 2024-03-20 DIAGNOSIS — Z803 Family history of malignant neoplasm of breast: Secondary | ICD-10-CM

## 2024-03-20 DIAGNOSIS — R5082 Postprocedural fever: Secondary | ICD-10-CM | POA: Diagnosis not present

## 2024-03-20 DIAGNOSIS — Z79899 Other long term (current) drug therapy: Secondary | ICD-10-CM

## 2024-03-20 DIAGNOSIS — Z87442 Personal history of urinary calculi: Secondary | ICD-10-CM

## 2024-03-20 DIAGNOSIS — E66813 Obesity, class 3: Secondary | ICD-10-CM | POA: Diagnosis present

## 2024-03-20 DIAGNOSIS — R350 Frequency of micturition: Secondary | ICD-10-CM | POA: Diagnosis not present

## 2024-03-20 DIAGNOSIS — Z6841 Body Mass Index (BMI) 40.0 and over, adult: Secondary | ICD-10-CM

## 2024-03-20 DIAGNOSIS — K760 Fatty (change of) liver, not elsewhere classified: Secondary | ICD-10-CM | POA: Diagnosis present

## 2024-03-20 DIAGNOSIS — Z881 Allergy status to other antibiotic agents status: Secondary | ICD-10-CM

## 2024-03-20 HISTORY — PX: CYSTOSCOPY W/ URETERAL STENT PLACEMENT: SHX1429

## 2024-03-20 HISTORY — PX: ROBOT ASSISTED PYELOPLASTY: SHX5143

## 2024-03-20 HISTORY — PX: CYSTOSCOPY W/ RETROGRADES: SHX1426

## 2024-03-20 HISTORY — PX: DECORTICATION, CYST, KIDNEY, ROBOT-ASSISTED: SHX7552

## 2024-03-20 LAB — TYPE AND SCREEN
ABO/RH(D): O POS
Antibody Screen: NEGATIVE

## 2024-03-20 LAB — GLUCOSE, CAPILLARY
Glucose-Capillary: 111 mg/dL — ABNORMAL HIGH (ref 70–99)
Glucose-Capillary: 132 mg/dL — ABNORMAL HIGH (ref 70–99)

## 2024-03-20 LAB — HEMOGLOBIN AND HEMATOCRIT, BLOOD
HCT: 39.6 % (ref 36.0–46.0)
Hemoglobin: 12.3 g/dL (ref 12.0–15.0)

## 2024-03-20 SURGERY — PYELOPLASTY, ROBOT-ASSISTED
Anesthesia: General | Site: Pelvis | Laterality: Left

## 2024-03-20 MED ORDER — FENTANYL CITRATE PF 50 MCG/ML IJ SOSY
PREFILLED_SYRINGE | INTRAMUSCULAR | Status: AC
  Filled 2024-03-20: qty 2

## 2024-03-20 MED ORDER — LACTATED RINGERS IR SOLN
Status: DC | PRN
  Administered 2024-03-20: 1

## 2024-03-20 MED ORDER — DOCUSATE SODIUM 100 MG PO CAPS
100.0000 mg | ORAL_CAPSULE | Freq: Two times a day (BID) | ORAL | Status: DC
  Administered 2024-03-20 – 2024-03-25 (×10): 100 mg via ORAL
  Filled 2024-03-20 (×10): qty 1

## 2024-03-20 MED ORDER — OXYCODONE HCL 5 MG PO TABS: 5.0000 mg | ORAL_TABLET | Freq: Once | ORAL | Status: DC | PRN

## 2024-03-20 MED ORDER — ACETAMINOPHEN 500 MG PO TABS
1000.0000 mg | ORAL_TABLET | Freq: Once | ORAL | Status: AC
Start: 2024-03-20 — End: 2024-03-20
  Administered 2024-03-20: 1000 mg via ORAL
  Filled 2024-03-20: qty 2

## 2024-03-20 MED ORDER — LOSARTAN POTASSIUM 50 MG PO TABS
100.0000 mg | ORAL_TABLET | Freq: Every day | ORAL | Status: DC
  Filled 2024-03-20: qty 2

## 2024-03-20 MED ORDER — SODIUM CHLORIDE 0.9% FLUSH
INTRAVENOUS | Status: DC | PRN
  Administered 2024-03-20: 20 mL

## 2024-03-20 MED ORDER — ACETAMINOPHEN 500 MG PO TABS
1000.0000 mg | ORAL_TABLET | Freq: Four times a day (QID) | ORAL | Status: DC
  Administered 2024-03-20: 1000 mg via ORAL
  Filled 2024-03-20 (×2): qty 2

## 2024-03-20 MED ORDER — ONDANSETRON HCL 4 MG/2ML IJ SOLN
4.0000 mg | INTRAMUSCULAR | Status: DC | PRN
  Administered 2024-03-20: 4 mg via INTRAVENOUS
  Filled 2024-03-20: qty 2

## 2024-03-20 MED ORDER — BUPIVACAINE LIPOSOME 1.3 % IJ SUSP
INTRAMUSCULAR | Status: AC
Start: 2024-03-20 — End: 2024-03-20
  Filled 2024-03-20: qty 20

## 2024-03-20 MED ORDER — PHENYLEPHRINE HCL-NACL 20-0.9 MG/250ML-% IV SOLN
INTRAVENOUS | Status: DC | PRN
Start: 2024-03-20 — End: 2024-03-20
  Administered 2024-03-20: 25 ug/min via INTRAVENOUS

## 2024-03-20 MED ORDER — MIDAZOLAM HCL 5 MG/5ML IJ SOLN
INTRAMUSCULAR | Status: DC | PRN
  Administered 2024-03-20: 2 mg via INTRAVENOUS

## 2024-03-20 MED ORDER — DIPHENHYDRAMINE HCL 12.5 MG/5ML PO ELIX: 12.5000 mg | ORAL_SOLUTION | Freq: Four times a day (QID) | ORAL | Status: DC | PRN

## 2024-03-20 MED ORDER — ALBUTEROL SULFATE (2.5 MG/3ML) 0.083% IN NEBU: 2.5000 mg | INHALATION_SOLUTION | RESPIRATORY_TRACT | Status: DC | PRN

## 2024-03-20 MED ORDER — MONTELUKAST SODIUM 10 MG PO TABS
10.0000 mg | ORAL_TABLET | Freq: Every morning | ORAL | Status: DC
  Administered 2024-03-21 – 2024-03-25 (×5): 10 mg via ORAL
  Filled 2024-03-20 (×5): qty 1

## 2024-03-20 MED ORDER — HYDROCHLOROTHIAZIDE 12.5 MG PO TABS
12.5000 mg | ORAL_TABLET | Freq: Every day | ORAL | Status: DC
  Filled 2024-03-20: qty 1

## 2024-03-20 MED ORDER — LACTATED RINGERS IV SOLN: INTRAVENOUS | Status: DC | PRN

## 2024-03-20 MED ORDER — ONDANSETRON HCL 4 MG/2ML IJ SOLN: 4.0000 mg | Freq: Once | INTRAMUSCULAR | Status: DC | PRN

## 2024-03-20 MED ORDER — MAGNESIUM CITRATE PO SOLN
1.0000 | Freq: Once | ORAL | Status: DC
  Filled 2024-03-20: qty 296

## 2024-03-20 MED ORDER — ROCURONIUM BROMIDE 10 MG/ML (PF) SYRINGE
PREFILLED_SYRINGE | INTRAVENOUS | Status: AC
  Filled 2024-03-20: qty 20

## 2024-03-20 MED ORDER — ROCURONIUM BROMIDE 100 MG/10ML IV SOLN
INTRAVENOUS | Status: DC | PRN
  Administered 2024-03-20: 20 mg via INTRAVENOUS
  Administered 2024-03-20: 80 mg via INTRAVENOUS
  Administered 2024-03-20: 10 mg via INTRAVENOUS

## 2024-03-20 MED ORDER — HYDROXYZINE HCL 25 MG PO TABS
25.0000 mg | ORAL_TABLET | Freq: Two times a day (BID) | ORAL | Status: DC
  Administered 2024-03-20 – 2024-03-25 (×10): 25 mg via ORAL
  Filled 2024-03-20 (×10): qty 1

## 2024-03-20 MED ORDER — FENTANYL CITRATE (PF) 250 MCG/5ML IJ SOLN
INTRAMUSCULAR | Status: AC
  Filled 2024-03-20: qty 5

## 2024-03-20 MED ORDER — SODIUM CHLORIDE 0.9 % IV SOLN
INTRAVENOUS | Status: AC
Start: 2024-03-20 — End: 2024-03-21

## 2024-03-20 MED ORDER — HYOSCYAMINE SULFATE 0.125 MG SL SUBL: 0.1250 mg | SUBLINGUAL_TABLET | SUBLINGUAL | Status: DC | PRN

## 2024-03-20 MED ORDER — URSODIOL 300 MG PO CAPS
300.0000 mg | ORAL_CAPSULE | Freq: Two times a day (BID) | ORAL | Status: DC
  Administered 2024-03-20 – 2024-03-25 (×10): 300 mg via ORAL
  Filled 2024-03-20 (×10): qty 1

## 2024-03-20 MED ORDER — GLYCOPYRROLATE 0.2 MG/ML IJ SOLN
INTRAMUSCULAR | Status: DC | PRN
  Administered 2024-03-20: .2 mg via INTRAVENOUS

## 2024-03-20 MED ORDER — PROPOFOL 10 MG/ML IV BOLUS
INTRAVENOUS | Status: DC | PRN
  Administered 2024-03-20: 25 ug/kg/min via INTRAVENOUS
  Administered 2024-03-20: 200 mg via INTRAVENOUS

## 2024-03-20 MED ORDER — SUGAMMADEX SODIUM 200 MG/2ML IV SOLN
INTRAVENOUS | Status: DC | PRN
  Administered 2024-03-20: 200 mg via INTRAVENOUS

## 2024-03-20 MED ORDER — IOHEXOL 300 MG/ML  SOLN
INTRAMUSCULAR | Status: DC | PRN
  Administered 2024-03-20: 6 mL via URETHRAL

## 2024-03-20 MED ORDER — MIDAZOLAM HCL 2 MG/2ML IJ SOLN
INTRAMUSCULAR | Status: AC
  Filled 2024-03-20: qty 2

## 2024-03-20 MED ORDER — ORAL CARE MOUTH RINSE: 15.0000 mL | Freq: Once | OROMUCOSAL | Status: AC

## 2024-03-20 MED ORDER — FENTANYL CITRATE PF 50 MCG/ML IJ SOSY
PREFILLED_SYRINGE | INTRAMUSCULAR | Status: AC
  Filled 2024-03-20: qty 1

## 2024-03-20 MED ORDER — DOCUSATE SODIUM 100 MG PO CAPS: 100.0000 mg | ORAL_CAPSULE | Freq: Two times a day (BID) | ORAL | Status: AC

## 2024-03-20 MED ORDER — HYDROCODONE-ACETAMINOPHEN 5-325 MG PO TABS
1.0000 | ORAL_TABLET | Freq: Four times a day (QID) | ORAL | 0 refills | Status: AC | PRN
  Filled 2024-03-20: qty 20, 3d supply, fill #0

## 2024-03-20 MED ORDER — PROPOFOL 10 MG/ML IV BOLUS
INTRAVENOUS | Status: AC
  Filled 2024-03-20: qty 20

## 2024-03-20 MED ORDER — BUDESON-GLYCOPYRROL-FORMOTEROL 160-9-4.8 MCG/ACT IN AERO
2.0000 | INHALATION_SPRAY | Freq: Two times a day (BID) | RESPIRATORY_TRACT | Status: DC
  Administered 2024-03-20 – 2024-03-24 (×7): 2 via RESPIRATORY_TRACT
  Filled 2024-03-20: qty 5.9

## 2024-03-20 MED ORDER — CELECOXIB 200 MG PO CAPS
200.0000 mg | ORAL_CAPSULE | Freq: Once | ORAL | Status: AC
  Administered 2024-03-20: 200 mg via ORAL
  Filled 2024-03-20: qty 1

## 2024-03-20 MED ORDER — ONDANSETRON HCL 4 MG/2ML IJ SOLN
INTRAMUSCULAR | Status: DC | PRN
  Administered 2024-03-20: 4 mg via INTRAVENOUS

## 2024-03-20 MED ORDER — CEFAZOLIN SODIUM-DEXTROSE 2-4 GM/100ML-% IV SOLN
INTRAVENOUS | Status: AC
  Filled 2024-03-20: qty 100

## 2024-03-20 MED ORDER — LIDOCAINE HCL (CARDIAC) PF 100 MG/5ML IV SOSY
PREFILLED_SYRINGE | INTRAVENOUS | Status: DC | PRN
  Administered 2024-03-20: 100 mg via INTRAVENOUS

## 2024-03-20 MED ORDER — FENTANYL CITRATE PF 50 MCG/ML IJ SOSY: 25.0000 ug | PREFILLED_SYRINGE | INTRAMUSCULAR | Status: DC | PRN

## 2024-03-20 MED ORDER — DIPHENHYDRAMINE HCL 50 MG/ML IJ SOLN: 12.5000 mg | Freq: Four times a day (QID) | INTRAMUSCULAR | Status: DC | PRN

## 2024-03-20 MED ORDER — FENTANYL CITRATE PF 50 MCG/ML IJ SOSY
25.0000 ug | PREFILLED_SYRINGE | INTRAMUSCULAR | Status: DC | PRN
  Administered 2024-03-20 (×2): 50 ug via INTRAVENOUS

## 2024-03-20 MED ORDER — ORAL CARE MOUTH RINSE: 15.0000 mL | OROMUCOSAL | Status: DC | PRN

## 2024-03-20 MED ORDER — MEPERIDINE HCL 100 MG/ML IJ SOLN: 6.2500 mg | INTRAMUSCULAR | Status: DC | PRN

## 2024-03-20 MED ORDER — CEFAZOLIN SODIUM-DEXTROSE 3-4 GM/150ML-% IV SOLN
3.0000 g | INTRAVENOUS | Status: AC
Start: 2024-03-20 — End: 2024-03-20
  Administered 2024-03-20: 3 g via INTRAVENOUS
  Filled 2024-03-20: qty 150

## 2024-03-20 MED ORDER — ROSUVASTATIN CALCIUM 5 MG PO TABS
5.0000 mg | ORAL_TABLET | Freq: Every evening | ORAL | Status: DC
  Administered 2024-03-20 – 2024-03-24 (×5): 5 mg via ORAL
  Filled 2024-03-20 (×5): qty 1

## 2024-03-20 MED ORDER — INSULIN ASPART 100 UNIT/ML IJ SOLN: 0.0000 [IU] | INTRAMUSCULAR | Status: DC | PRN

## 2024-03-20 MED ORDER — DOCUSATE SODIUM 100 MG PO CAPS: 100.0000 mg | ORAL_CAPSULE | Freq: Two times a day (BID) | ORAL | Status: DC

## 2024-03-20 MED ORDER — CHLORHEXIDINE GLUCONATE 0.12 % MT SOLN
15.0000 mL | Freq: Once | OROMUCOSAL | Status: AC
  Administered 2024-03-20: 15 mL via OROMUCOSAL

## 2024-03-20 MED ORDER — LACTATED RINGERS IV SOLN: INTRAVENOUS | Status: DC

## 2024-03-20 MED ORDER — OXYCODONE HCL 5 MG PO TABS
5.0000 mg | ORAL_TABLET | ORAL | Status: DC | PRN
  Administered 2024-03-20 – 2024-03-21 (×3): 5 mg via ORAL
  Filled 2024-03-20 (×3): qty 1

## 2024-03-20 MED ORDER — DEXMEDETOMIDINE HCL IN NACL 80 MCG/20ML IV SOLN
INTRAVENOUS | Status: DC | PRN
Start: 2024-03-20 — End: 2024-03-20
  Administered 2024-03-20: 8 ug via INTRAVENOUS

## 2024-03-20 MED ORDER — BUPIVACAINE LIPOSOME 1.3 % IJ SUSP
INTRAMUSCULAR | Status: DC | PRN
  Administered 2024-03-20: 20 mL

## 2024-03-20 MED ORDER — FENTANYL CITRATE (PF) 100 MCG/2ML IJ SOLN
INTRAMUSCULAR | Status: DC | PRN
  Administered 2024-03-20: 50 ug via INTRAVENOUS
  Administered 2024-03-20: 100 ug via INTRAVENOUS

## 2024-03-20 MED ORDER — LOSARTAN POTASSIUM-HCTZ 100-12.5 MG PO TABS
1.0000 | ORAL_TABLET | Freq: Every day | ORAL | Status: DC
Start: 2024-03-20 — End: 2024-03-20

## 2024-03-20 MED ORDER — SODIUM CHLORIDE (PF) 0.9 % IJ SOLN
INTRAMUSCULAR | Status: AC
Start: 2024-03-20 — End: 2024-03-20
  Filled 2024-03-20: qty 20

## 2024-03-20 MED ORDER — WATER FOR IRRIGATION, STERILE IR SOLN
Status: DC | PRN
  Administered 2024-03-20: 1000 mL

## 2024-03-20 MED ORDER — OXYCODONE HCL 5 MG/5ML PO SOLN: 5.0000 mg | Freq: Once | ORAL | Status: DC | PRN

## 2024-03-20 MED ORDER — TIZANIDINE HCL 4 MG PO TABS
4.0000 mg | ORAL_TABLET | Freq: Three times a day (TID) | ORAL | Status: DC | PRN
  Administered 2024-03-20 – 2024-03-22 (×4): 4 mg via ORAL
  Filled 2024-03-20 (×4): qty 1

## 2024-03-20 MED ORDER — PHENYLEPHRINE HCL-NACL 20-0.9 MG/250ML-% IV SOLN
INTRAVENOUS | Status: AC
  Filled 2024-03-20: qty 250

## 2024-03-20 SURGICAL SUPPLY — 60 items
BAG COUNTER SPONGE SURGICOUNT (BAG) IMPLANT
BAG URO CATCHER STRL LF (MISCELLANEOUS) ×2 IMPLANT
BASKET ZERO TIP NITINOL 2.4FR (BASKET) IMPLANT
CATH URETL OPEN END 6FR 70 (CATHETERS) IMPLANT
CHLORAPREP W/TINT 26 (MISCELLANEOUS) ×2 IMPLANT
CLIP LIGATING HEM O LOK PURPLE (MISCELLANEOUS) ×2 IMPLANT
CLIP LIGATING HEMO O LOK GREEN (MISCELLANEOUS) ×2 IMPLANT
CLOTH BEACON ORANGE TIMEOUT ST (SAFETY) ×2 IMPLANT
COVER SURGICAL LIGHT HANDLE (MISCELLANEOUS) ×2 IMPLANT
COVER TIP SHEARS 8 DVNC (MISCELLANEOUS) ×2 IMPLANT
DERMABOND ADVANCED .7 DNX12 (GAUZE/BANDAGES/DRESSINGS) ×2 IMPLANT
DRAIN CHANNEL 15F RND FF 3/16 (WOUND CARE) ×2 IMPLANT
DRAPE ARM DVNC X/XI (DISPOSABLE) ×8 IMPLANT
DRAPE COLUMN DVNC XI (DISPOSABLE) ×2 IMPLANT
DRAPE INCISE IOBAN 66X45 STRL (DRAPES) ×2 IMPLANT
DRAPE SHEET LG 3/4 BI-LAMINATE (DRAPES) ×2 IMPLANT
DRIVER NDL LRG 8 DVNC XI (INSTRUMENTS) ×4 IMPLANT
DRIVER NDLE LRG 8 DVNC XI (INSTRUMENTS) ×4 IMPLANT
ELECT PENCIL ROCKER SW 15FT (MISCELLANEOUS) ×2 IMPLANT
ELECT REM PT RETURN 15FT ADLT (MISCELLANEOUS) ×2 IMPLANT
EVACUATOR SILICONE 100CC (DRAIN) ×2 IMPLANT
FORCEPS BPLR FENES DVNC XI (FORCEP) ×2 IMPLANT
FORCEPS PROGRASP DVNC XI (FORCEP) ×2 IMPLANT
GLOVE BIO SURGEON STRL SZ 6.5 (GLOVE) ×2 IMPLANT
GLOVE SURG LX STRL 7.5 STRW (GLOVE) ×4 IMPLANT
GOWN STRL REUS W/ TWL XL LVL3 (GOWN DISPOSABLE) ×4 IMPLANT
GOWN STRL SURGICAL XL XLNG (GOWN DISPOSABLE) ×2 IMPLANT
GUIDEWIRE ANG ZIPWIRE 038X150 (WIRE) ×2 IMPLANT
GUIDEWIRE STR DUAL SENSOR (WIRE) ×2 IMPLANT
IRRIGATION SUCT STRKRFLW 2 WTP (MISCELLANEOUS) ×2 IMPLANT
KIT BASIN OR (CUSTOM PROCEDURE TRAY) ×2 IMPLANT
KIT TURNOVER KIT A (KITS) ×2 IMPLANT
LOOP VESSEL MAXI BLUE (MISCELLANEOUS) IMPLANT
MANIFOLD NEPTUNE II (INSTRUMENTS) ×2 IMPLANT
NDL INSUFFLATION 14GA 120MM (NEEDLE) ×2 IMPLANT
NEEDLE INSUFFLATION 14GA 120MM (NEEDLE) ×2 IMPLANT
NS IRRIG 1000ML POUR BTL (IV SOLUTION) ×2 IMPLANT
PACK CYSTO (CUSTOM PROCEDURE TRAY) ×2 IMPLANT
PORT ACCESS TROCAR AIRSEAL 12 (TROCAR) ×2 IMPLANT
PROTECTOR NERVE ULNAR (MISCELLANEOUS) ×4 IMPLANT
RELOAD STAPLE 45 2.6 WHT THIN (STAPLE) IMPLANT
SCISSORS MNPLR CVD DVNC XI (INSTRUMENTS) ×2 IMPLANT
SEAL UNIV 5-12 XI (MISCELLANEOUS) ×8 IMPLANT
SET TRI-LUMEN FLTR TB AIRSEAL (TUBING) ×2 IMPLANT
SOLUTION ELECTROSURG ANTI STCK (MISCELLANEOUS) ×2 IMPLANT
SPIKE FLUID TRANSFER (MISCELLANEOUS) ×2 IMPLANT
SPONGE T-LAP 4X18 ~~LOC~~+RFID (SPONGE) IMPLANT
STAPLER POWER ECHELON 45 WIDE (STAPLE) IMPLANT
STENT URET 6FRX24 CONTOUR (STENTS) IMPLANT
SUT ETHILON 3 0 PS 1 (SUTURE) ×2 IMPLANT
SUT MNCRL 3 0 VIOLET RB1 (SUTURE) ×4 IMPLANT
SUT MNCRL AB 4-0 PS2 18 (SUTURE) ×4 IMPLANT
SUTURE VLOC BRB 180 ABS3/0GR12 (SUTURE) ×2 IMPLANT
TOWEL OR 17X26 10 PK STRL BLUE (TOWEL DISPOSABLE) ×2 IMPLANT
TRAY FOLEY MTR SLVR 16FR STAT (SET/KITS/TRAYS/PACK) ×2 IMPLANT
TRAY LAPAROSCOPIC (CUSTOM PROCEDURE TRAY) ×2 IMPLANT
TROCAR Z-THREAD OPTICAL 5X100M (TROCAR) IMPLANT
TUBE PU 8FR 16IN ENFIT (TUBING) IMPLANT
TUBING CONNECTING 10 (TUBING) ×2 IMPLANT
WATER STERILE IRR 1000ML POUR (IV SOLUTION) ×2 IMPLANT

## 2024-03-20 NOTE — Brief Op Note (Signed)
 03/20/2024  12:02 PM  PATIENT:  Samantha Park  63 y.o. female  PRE-OPERATIVE DIAGNOSIS:  LEFT RENAL CYST, URETEROPELVIC JUNCTION OBSTRUCTION  POST-OPERATIVE DIAGNOSIS:  LEFT RENAL CYST, URETEROPELVIC JUNCTION OBSTRUCTION  PROCEDURE:  Procedure(s): PYELOPLASTY, ROBOT-ASSISTED (Left) DECORTICATION, CYST, KIDNEY, ROBOT-ASSISTED (Left) CYSTOSCOPY, FLEXIBLE, WITH STENT REPLACEMENT (Left) CYSTOSCOPY, WITH RETROGRADE PYELOGRAM (Left)  SURGEON:  Surgeons and Role:    * Manny, Ricardo KATHEE Raddle., MD - Primary  PHYSICIAN ASSISTANT:   ASSISTANTS: Alan Hammonds PA   ANESTHESIA:   local and general  EBL:  50mL   BLOOD ADMINISTERED:none  DRAINS: 1 - JP to bulb; 2- Foley to gravity   LOCAL MEDICATIONS USED:  MARCAINE      SPECIMEN:  Source of Specimen:  left renal cyst wall  DISPOSITION OF SPECIMEN:  PATHOLOGY  COUNTS:  YES  TOURNIQUET:  * No tourniquets in log *  DICTATION: .Other Dictation: Dictation Number [id number]  PLAN OF CARE: Admit for overnight observation  PATIENT DISPOSITION:  PACU - hemodynamically stable.   Delay start of Pharmacological VTE agent (>24hrs) due to surgical blood loss or risk of bleeding: yes

## 2024-03-20 NOTE — Transfer of Care (Signed)
 Immediate Anesthesia Transfer of Care Note  Patient: Samantha Park  Procedure(s) Performed: PYELOPLASTY, ROBOT-ASSISTED (Left: Abdomen) DECORTICATION, CYST, KIDNEY, ROBOT-ASSISTED (Left: Abdomen) CYSTOSCOPY, FLEXIBLE, WITH STENT REPLACEMENT (Left: Pelvis) CYSTOSCOPY, WITH RETROGRADE PYELOGRAM (Left: Pelvis)  Patient Location: PACU  Anesthesia Type:General  Level of Consciousness: awake and alert   Airway & Oxygen Therapy: Patient Spontanous Breathing and Patient connected to nasal cannula oxygen  Post-op Assessment: Report given to RN and Post -op Vital signs reviewed and stable  Post vital signs: Reviewed and stable  Last Vitals:  Vitals Value Taken Time  BP    Temp    Pulse 66 03/20/24 12:20  Resp 25 03/20/24 12:21  SpO2 98 % 03/20/24 12:20  Vitals shown include unfiled device data.  Last Pain:  Vitals:   03/20/24 0923  TempSrc:   PainSc: 0-No pain         Complications: No notable events documented.

## 2024-03-20 NOTE — Op Note (Addendum)
 Note Dictated Separately by Dr. Alvaro

## 2024-03-20 NOTE — H&P (Signed)
 Samantha Park is an 63 y.o. female.    Chief Complaint: Pre-OP LEFT Robotic Pyeloplasty / Cyst Decortication + Cysto Stent  HPI:   1 - Moderate Left Hydronephrosis / Partial UPJO / Compression from Cyst - incidental left hydro and peripelvic cysts by US , then CT/MRI 06/2018 on eval fatty liver. Cr 0.8. No post-prandial flank pain. No significant renal parenchymal loss. 1 artery (?small lower pole branch) / 1 vein (large lumbar below artery, and large gonadal) left renovascular anatomy. Suspect mild UPJO mostly from mass effect of cyst compressing against lower pole vessel. No imaging series (includign priro delays on CT) clealry demostrate UPJ ureteral anatomy or contrast filling past renal pelvis. Renogram 2020 symmetric and T1/2 <77min bilateral. DX ureteroscopy 08/2018 confirms no left side intraluminal obstruction.   Recent Surveillacne:  09/2019 - US  - stable left hydro, Cyst max diameter 5.3 cm.  01/2024 - CT - stable left mild hydro, Cyst max diameter 5.9cm, no delayed nephrogram. New LLP 13mm non-obstructing stone   2 - Non-Complex Left Renal Pelvis Cyst - about 5cm left lower pole simple cyst with some mass effect on UPJ as per above.   3 - Incidental NON-obstructing left renal stone - 13mm LLP sotne on CT 2025, new from 2019, likely from secondary partial urine stasis as per above.   PMH sig for obesity/DM2, C spine surgery (no deficits), HTN. No ischemic CV disease / blood thinners. 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 Her PCP is Katheen Aisha   Today  Samantha Park  is seen to proceed with LEFT robotic pyeloplasty / cyst decortication + cysto-stent for progressive left hydro. Hgb 12.7, Cr 0.93 most recently. A1c 6.   Past Medical History:  Diagnosis Date   Anemia    yrs ago   Arthritis    Asthma    Complication of anesthesia    trouble waking up after knee surgery 2014, limited rom left shoulder   Cyst of left kidney    Diabetes mellitus without  complication (HCC)    Dyspnea    Elevated liver enzymes 01/2018   not current problem per pt, no liver problems noted with testing done   Flu 1-3-0-2020   recovered now, spouse had flu took tamiflu   History of kidney stones    Hypertension    PONV (postoperative nausea and vomiting)    Shoulder fracture 06/18/2018   left in physical therpay now   Vitamin D deficiency    hx of    Past Surgical History:  Procedure Laterality Date   BACK SURGERY  2016   cervical spine fusion   CYSTOSCOPY/RETROGRADE/URETEROSCOPY Left 08/29/2018   Procedure: CYSTOSCOPY/RETROGRADE/left URETEROSCOPY;  Surgeon: Alvaro Hummer, MD;  Location: [facility];  Service: Urology;  Laterality: Left;   KNEE ARTHROSCOPY Left july 2011   KNEE ARTHROSCOPY WITH MEDIAL MENISECTOMY Right 12/31/2012   Procedure: RIGHT KNEE ARTHROSCOPY WITH MEDIAL MENISECTOMY;  Surgeon: Tanda DELENA Heading, MD;  Location: [facility];  Service: Orthopedics;  Laterality: Right;   NASAL SEPTUM SURGERY  1984   TOENAIL EXCISION Left 1981   big toe    Family History  Problem Relation Age of Onset   Breast cancer Sister        half sister on mother's side   Social History:  reports that she has never smoked. She has never used smokeless tobacco. She reports that she does not drink alcohol and does not use drugs.  Allergies:  Allergies  Allergen  Reactions   Phentermine Anxiety    Other Reaction(s): anxiety, aggitation   Codeine Rash    Severe rash instantly - mainly Tylenol  #3   Cyclobenzaprine Other (See Comments)    Will be sleeping for 10 hours    Erythromycin Nausea Only   Hydromorphone  Other (See Comments)    Will be sleeping for 10 hours    Ketek [Telithromycin] Nausea And Vomiting   Largon [Propiomazine] Other (See Comments)    Dizziness    Meloxicam Itching and Rash   Methocarbamol Other (See Comments)    Knocks me out for 10 hours   Stadol [Butorphanol] Nausea And Vomiting   Tramadol  Other (See Comments)     Looked and acted like I was in a drunk stupor after taking it    No medications prior to admission.    No results found for this or any previous visit (from the past 48 hours). No results found.  Review of Systems  Constitutional:  Negative for chills and fever.  All other systems reviewed and are negative.   There were no vitals taken for this visit. Physical Exam Vitals reviewed.  HENT:     Head: Normocephalic.  Eyes:     Pupils: Pupils are equal, round, and reactive to light.  Cardiovascular:     Rate and Rhythm: Normal rate.  Pulmonary:     Effort: Pulmonary effort is normal.  Abdominal:     Comments: Stable large truncal obesity  Genitourinary:    Comments: No CVAT at present Musculoskeletal:        General: Normal range of motion.     Cervical back: Normal range of motion.  Skin:    General: Skin is warm.  Neurological:     General: No focal deficit present.     Mental Status: She is alert.  Psychiatric:        Mood and Affect: Mood normal.      Assessment/Plan  Proceed as planned with LEFT robotic pyeloplasty / cyst decortication + cysto stent. Risks, benefits, alternatives, expected peri-op course discussed previously and reiterated today. She understands that observation is an option as well.   Ricardo KATHEE Alvaro Mickey., MD 03/20/2024, 6:51 AM

## 2024-03-20 NOTE — Discharge Instructions (Addendum)
 1- Drain Sites - You may have some mild persistent drainage from old drain site for several days, this is normal. This can be covered with cotton gauze for convenience.  2 - Stiches - Your stitches are all dissolvable. You may notice a loose thread at your incisions, these are normal and require no intervention. You may cut them flush to the skin with fingernail clippers if needed for comfort.  3 - Diet - No restrictions  4 - Activity - No heavy lifting / straining (any activities that require valsalva or bearing down) x 4 weeks. Otherwise, no restrictions.  5 - Bathing - You may shower immediately. Do not take a bath or get into swimming pool where incision sites are submersed in water  x 4 weeks.   6 -  When to Call the Doctor - Call MD for any fever >102, any acute wound problems, or any severe nausea / vomiting. You can call the [facility] [phone]) 24 hours a day 365 days a year. It will roll-over to the answering service and on-call physician after hours.    You may resume aspirin, advil, aleve, vitmains, and supplements 7 days after surgery.

## 2024-03-20 NOTE — Anesthesia Postprocedure Evaluation (Signed)
 Anesthesia Post Note  Patient: Samantha Park  Procedure(s) Performed: PYELOPLASTY, ROBOT-ASSISTED (Left: Abdomen) DECORTICATION, CYST, KIDNEY, ROBOT-ASSISTED (Left: Abdomen) CYSTOSCOPY, FLEXIBLE, WITH STENT REPLACEMENT (Left: Pelvis) CYSTOSCOPY, WITH RETROGRADE PYELOGRAM (Left: Pelvis)     Patient location during evaluation: PACU Anesthesia Type: General Level of consciousness: awake and alert Pain management: pain level controlled Vital Signs Assessment: post-procedure vital signs reviewed and stable Respiratory status: spontaneous breathing, nonlabored ventilation, respiratory function stable and patient connected to nasal cannula oxygen Cardiovascular status: blood pressure returned to baseline and stable Postop Assessment: no apparent nausea or vomiting Anesthetic complications: no   No notable events documented.  Last Vitals:  Vitals:   03/20/24 1330 03/20/24 1404  BP: 136/82 132/65  Pulse: 64 70  Resp: 18 20  Temp: 36.4 C 36.5 C  SpO2: 95% 96%    Last Pain:  Vitals:   03/20/24 1423  TempSrc:   PainSc: 8                  Dakiyah Heinke

## 2024-03-20 NOTE — Op Note (Signed)
 Samantha Park, Park MEDICAL RECORD NO: [id number] ACCOUNT NO: [id number] DATE OF BIRTH: September 16, 1960 FACILITY: [facility] LOCATION: [facility] PHYSICIAN: Ricardo Likens, MD  Operative Report   SURGEON:  Ricardo Likens, MD.  PREOPERATIVE DIAGNOSES:  Left hydronephrosis and lower pole cyst.  PROCEDURE PERFORMED: 1.  Cystoscopy, left retrograde pyelogram with interpretation. 2.  Insertion of left ureteral stent. 3.  Laparoscopic left pyeloplasty with renal cyst decortication.  ESTIMATED BLOOD LOSS:  50 mL.  COMPLICATIONS:  None.  SPECIMENS:  Renal cyst wall on the left for permanent pathology.  FINDINGS: 1.  Mild left hydronephrosis due to extrinsic compression from renal cyst. 2.  No true lower pole crossing vessel.  DRAINS: 1.  Jackson-Pratt drain to bulb suction. 2.  Foley catheter to straight drain.  ASSISTANT:  Alan Hammonds, PA.  INDICATIONS:  The patient is a pleasant 63 year old lady with a known history of left hydronephrosis for years.  She underwent evaluation years ago with a renography which revealed no high-grade obstruction and diagnostic ureteroscopy which confirmed no  intrinsic compression.  She does have a known lower pole renal cyst that appeared to be causing some partial compression of the ureter against just a lower branch vessel. Given the non-high-grade obstruction, she was observed for a number of years.  She  subsequently on follow-up imaging was found to have worsening hydronephrosis and also some secondary renal stones likely from poor drainage, likely signifying some element of obstruction due to this physiology.  Options were discussed including  observation versus chronic stenting versus more definitive management with pyeloplasty and cyst decortication and she wished to proceed with the latter.  Informed consent was obtained and placed in the medical record.  DESCRIPTION OF PROCEDURE:  The patient being Samantha Park  verified and patient being cysto, left stent  and left robotic pyeloplasty with cyst decortication was confirmed. Procedure timeout was performed and intravenous antibiotics administered.  General  endotracheal anesthesia induced.  The patient was initially placed into a low lithotomy position.  A sterile field was created, prepped and draped the patient's vagina, introitus and proximal thighs using iodine.  Cystourethroscopy was performed using a  21-French rigid cystoscope with offset lens.  Inspection of the urinary bladder revealed no diverticula, calcification, papillary lesions.  Ureteral orifices appeared single.  The left ureteral orifice was cannulated with a 6-French end hole catheter and  left retrograde pyelogram was obtained.  Left retrograde pyelogram demonstrated single left ureter, single system left kidney.  There was moderate hydronephrosis without ureteronephrosis and what appeared to be some kinking of the left ureter very close to the UPJ.  At this point, there was  some question whether this was a lower pole crossing vessel versus extrinsic compression alone. 0.038 ZIPwire was advanced at the level of the upper pole and a new 6 x 24 Contour type stent was securely placed using cystoscopic and fluoroscopic guidance.   Good proximal and distal planes were noted.  Foley catheter was placed per urethra to straight drain.  The patient was then repositioned into a left side up full flank position employing 15 degrees of table flexion, superior arm elevator, axillary  roll, sequential compression devices, bottom leg bent, top leg straight.  She was further fastened to the operating table using 3-inch tape over foam padding across her supraxiphoid chest and her pelvis.  A beanbag was deployed. A sterile field was  created again using chlorhexidine  gluconate ____ left flank and abdomen and a high-flow, low-pressure pneumoperitoneum was obtained using Veress technique  in the right lower quadrant having passed the aspiration and drop test.   An 8-mm robotic camera  port was then placed and positioned approximately 2 handbreadths superolateral to the umbilicus.  Laparoscopic examination of peritoneal cavity revealed no significant adhesions.  No visceral injury.  Additional ports were placed as follows:  Left  subcostal 8-mm robotic port, a left far lateral 8-mm robotic port proximally 1 handbreadth superomedial to the anterior superior iliac spine, a left paramedian inferior robotic port approximately 2 handbreadths superior to the pubic ramus, and one 12-mm  assistant port in a paramedian location approximately 3 fingerbreadths medial and inferior to the camera port.  The robot was docked and passed the electronic checks.  Attention was directed at development of the retroperitoneum.  Incision was made  lateral to the descending colon from the area of the splenic flexure towards the area of the internal ring.  The colon was carefully swept medially.  There was significant intraabdominal adiposity and mesentery was quite thick.  This was very carefully  mobilized off of the anterior surface of Gerota's fascia allowing the colon to fall medially.  Lateral splenic attachments were taken down allowing the spleen and the tail of pancreas to rotate medially, providing better visualization of the  retroperitoneum. The lower pole of the kidney was identified and placed on gentle lateral traction and dissected medial to this. The ureter and gonadal vessels were encountered.  They were at this point swept laterally. Psoas musculature was identified  and dissection proceeded towards the renal hilum.  Renal hilum was visualized and mobilized enough such that the superior and inferior aspects of the renal vein were clearly visible just lateral to the aorta, so that clamping could be performed later if  needed.  Attention was then redirected to further mobilization of the ureter and identification of the area of obstruction.  The ureter was circumferentially  mobilized towards the area of the UPJ and as anticipated towards the area of the lower pole of  the kidney, a large but simple-appearing cyst came into view and this was clearly providing significant mass effect on the ureter anteriorly.  This was traced all the way to the UPJ and again there was no frank crossing vessels per se.  However, the cyst  was extrinsically compressing the area of the UPJ against just a lower pole vessel.  That was a branch of the main artery, but not necessarily a separate aberrant vessel. It was clearly felt that the cyst was the dominant source of the extrinsic  compression and obstruction and that addressing this alone in a non-dismembered pyeloplasty fashion would relieve the obstruction.  The cyst was very carefully circumferentially mobilized.  The cyst base was visualized at its interface with the  parenchyma all around.  The cyst was purposefully incised, drained of simple straw-colored fluid, and then excised such that any remaining cyst mucosa was flush with the kidney.  The cyst wall specimen was set aside for permanent pathology.  The cyst  wall that was in apposition to the parenchyma was then very carefully fulgurated completely in an effort to prevent cyst reformation and a small Surgicel bolster was applied into this area again to purposely promote some inflammation in the area to  prevent cyst recurrence. Following this, there was complete resolution of the extrinsic compression of the ureter. Hemostasis was excellent. There were no obvious entrances into the collecting system; however, given the significant mobilization and  especially mobilization of the anterior cyst wall,  I did feel that leaving a drain in place would be prudent.  A closed suction drain was brought through the previous left lateral most robotic port site into the area of the peritoneal cavity.  The robot  was then undocked.  The previous 12-mm assistant port site was closed at the level of the  fascia using a Carter-Thomason suture passer and 0 Vicryl. All incision sites were infiltrated with dilute lipolyzed Marcaine  and closed at the level of the skin  using subcuticular Monocryl followed by Dermabond.  Procedure then terminated.  The patient tolerated the procedure well.  No immediate periprocedural complications.  The patient was taken to the post-anesthesia care unit in stable condition with a plan  for observation, admission.  Please note, assistant Alan Hammonds was crucial to all portions of the surgery today. She provided invaluable retraction, suctioning, robotic instrument exchange, bolster manipulation, and general first assistance.  Please note, the vessel loop on the ureter was removed and accounted for prior to cessation of surgery.   NIK D: 03/20/2024 12:12:33 pm T: 03/20/2024 11:39:00 pm  JOB: [id number]/ [id number]

## 2024-03-20 NOTE — Anesthesia Procedure Notes (Signed)
 Procedure Name: Intubation Date/Time: 03/20/2024 10:12 AM  Performed by: Dartha Meckel, CRNAPre-anesthesia Checklist: Patient identified, Emergency Drugs available, Suction available and Patient being monitored Patient Re-evaluated:Patient Re-evaluated prior to induction Oxygen Delivery Method: Circle system utilized Preoxygenation: Pre-oxygenation with 100% oxygen Induction Type: IV induction Ventilation: Mask ventilation without difficulty Laryngoscope Size: Glidescope and 3 Grade View: Grade I Tube type: Oral Tube size: 7.0 mm Number of attempts: 1 Airway Equipment and Method: Stylet and Oral airway Placement Confirmation: ETT inserted through vocal cords under direct vision, positive ETCO2 and breath sounds checked- equal and bilateral Secured at: 21 cm Tube secured with: Tape Dental Injury: Teeth and Oropharynx as per pre-operative assessment

## 2024-03-21 ENCOUNTER — Encounter (HOSPITAL_COMMUNITY): Payer: Self-pay | Admitting: Urology

## 2024-03-21 DIAGNOSIS — N132 Hydronephrosis with renal and ureteral calculous obstruction: Secondary | ICD-10-CM | POA: Diagnosis not present

## 2024-03-21 DIAGNOSIS — E119 Type 2 diabetes mellitus without complications: Secondary | ICD-10-CM | POA: Diagnosis not present

## 2024-03-21 DIAGNOSIS — J189 Pneumonia, unspecified organism: Secondary | ICD-10-CM | POA: Diagnosis not present

## 2024-03-21 DIAGNOSIS — N135 Crossing vessel and stricture of ureter without hydronephrosis: Secondary | ICD-10-CM

## 2024-03-21 DIAGNOSIS — Z6841 Body Mass Index (BMI) 40.0 and over, adult: Secondary | ICD-10-CM | POA: Diagnosis not present

## 2024-03-21 LAB — BASIC METABOLIC PANEL WITH GFR
Anion gap: 12 (ref 5–15)
BUN: 13 mg/dL (ref 8–23)
CO2: 20 mmol/L — ABNORMAL LOW (ref 22–32)
Calcium: 8.8 mg/dL — ABNORMAL LOW (ref 8.9–10.3)
Chloride: 106 mmol/L (ref 98–111)
Creatinine, Ser: 1.01 mg/dL — ABNORMAL HIGH (ref 0.44–1.00)
GFR, Estimated: >60 mL/min (ref >60)
Glucose, Bld: 112 mg/dL — ABNORMAL HIGH (ref 70–99)
Potassium: 4.3 mmol/L (ref 3.5–5.1)
Sodium: 138 mmol/L (ref 135–145)

## 2024-03-21 LAB — HEMOGLOBIN AND HEMATOCRIT, BLOOD
HCT: 35 % — ABNORMAL LOW (ref 36.0–46.0)
Hemoglobin: 11.2 g/dL — ABNORMAL LOW (ref 12.0–15.0)

## 2024-03-21 LAB — MAGNESIUM: Magnesium: 2.2 mg/dL (ref 1.7–2.4)

## 2024-03-21 MED ORDER — ACETAMINOPHEN 325 MG PO TABS
ORAL_TABLET | ORAL | Status: AC
  Filled 2024-03-21: qty 2

## 2024-03-21 MED ORDER — ACETAMINOPHEN 325 MG PO TABS
650.0000 mg | ORAL_TABLET | Freq: Four times a day (QID) | ORAL | Status: DC | PRN
  Administered 2024-03-21 – 2024-03-23 (×4): 650 mg via ORAL
  Filled 2024-03-21 (×3): qty 2

## 2024-03-21 NOTE — Consult Note (Signed)
 [facility] Initial Consultation Note  Samantha Park FMW:[id number] DOB: 05-07-1961 DOA: 03/20/2024  PCP: Aisha Harvey, MD   Requesting Physician: Dr. Twylla  Reason for Consultation: Palpitations  HPI: Samantha Park is a 63 y.o. female with medical history significant for asthma, hypertension who underwent uncomplicated robotic assisted pyeloplasty with Dr. Alvaro on 9/12 and is now being seen for medical consult for palpitations.  Patient denies any history of cardiac problems including CAD, MI, or cardiac arrhythmia.  States that she has been recovering well from her surgery, she ambulated in the halls without difficulty this morning.  Later in the morning, she was taking a nap, she was awoken from sleep with a sensation of palpitations that lasted probably about 15 to 30 seconds.  There was no associated shortness of breath, dizziness, or chest pain.  This sensation seemed to resolve on its own.  During my interview, she tells me that she is having recurrence of the sensation of jitteriness in her chest.   Review of Systems: Please see HPI for pertinent positives and negatives. A complete 10 system review of systems are otherwise negative.  Past Medical History:  Diagnosis Date   Anemia    yrs ago   Arthritis    Asthma    Complication of anesthesia    trouble waking up after knee surgery 2014, limited rom left shoulder   Cyst of left kidney    Diabetes mellitus without complication (HCC)    Dyspnea    Elevated liver enzymes 01/2018   not current problem per pt, no liver problems noted with testing done   Flu 1-3-0-2020   recovered now, spouse had flu took tamiflu   History of kidney stones    Hypertension    PONV (postoperative nausea and vomiting)    Shoulder fracture 06/18/2018   left in physical therpay now   Vitamin D deficiency    hx of   Past Surgical History:  Procedure Laterality Date   BACK SURGERY  2016   cervical spine fusion   CYSTOSCOPY W/  RETROGRADES Left 03/20/2024   Procedure: CYSTOSCOPY, WITH RETROGRADE PYELOGRAM;  Surgeon: Alvaro Ricardo KATHEE Mickey., MD;  Location: [facility];  Service: Urology;  Laterality: Left;   CYSTOSCOPY W/ URETERAL STENT PLACEMENT Left 03/20/2024   Procedure: CYSTOSCOPY, ENID, WITH STENT REPLACEMENT;  Surgeon: Alvaro Ricardo KATHEE Mickey., MD;  Location: [facility];  Service: Urology;  Laterality: Left;   CYSTOSCOPY/RETROGRADE/URETEROSCOPY Left 08/29/2018   Procedure: CYSTOSCOPY/RETROGRADE/left URETEROSCOPY;  Surgeon: Alvaro Ricardo, MD;  Location: [facility];  Service: Urology;  Laterality: Left;   DECORTICATION, CYST, KIDNEY, ROBOT-ASSISTED Left 03/20/2024   Procedure: DECORTICATION, CYST, KIDNEY, ROBOT-ASSISTED;  Surgeon: Alvaro Ricardo KATHEE Mickey., MD;  Location: [facility];  Service: Urology;  Laterality: Left;   KNEE ARTHROSCOPY Left july 2011   KNEE ARTHROSCOPY WITH MEDIAL MENISECTOMY Right 12/31/2012   Procedure: RIGHT KNEE ARTHROSCOPY WITH MEDIAL MENISECTOMY;  Surgeon: Tanda DELENA Heading, MD;  Location: [facility];  Service: Orthopedics;  Laterality: Right;   NASAL SEPTUM SURGERY  1984   ROBOT ASSISTED PYELOPLASTY Left 03/20/2024   Procedure: PYELOPLASTY, ROBOT-ASSISTED;  Surgeon: Alvaro Ricardo KATHEE Mickey., MD;  Location: [facility];  Service: Urology;  Laterality: Left;   TOENAIL EXCISION Left 1981   big toe    Social History:  reports that she has never smoked. She has never used smokeless tobacco. She reports that she does not drink alcohol and does not use drugs.  Allergies  Allergen Reactions  Phentermine Anxiety    Other Reaction(s): anxiety, aggitation   Codeine Rash    Severe rash instantly - mainly Tylenol  #3   Cyclobenzaprine Other (See Comments)    Will be sleeping for 10 hours    Erythromycin Nausea Only   Hydromorphone  Other (See Comments)    Will be sleeping for 10 hours    Ketek [Telithromycin] Nausea And Vomiting   Largon [Propiomazine] Other (See Comments)    Dizziness    Meloxicam Itching  and Rash   Methocarbamol Other (See Comments)    Knocks me out for 10 hours   Stadol [Butorphanol] Nausea And Vomiting   Tramadol  Other (See Comments)    Looked and acted like I was in a drunk stupor after taking it    Family History  Problem Relation Age of Onset   Breast cancer Sister        half sister on mother's side     Prior to Admission medications   Medication Sig Start Date End Date Taking? Authorizing Provider  BREZTRI  AEROSPHERE 160-9-4.8 MCG/ACT AERO inhaler Inhale 2 puffs into the lungs in the morning and at bedtime. 02/28/24  Yes [provider]  cetirizine (ZYRTEC) 10 MG tablet Take 10 mg by mouth daily.    Yes [provider]  Cholecalciferol (VITAMIN D3) 2000 units TABS Take 1 tablet by mouth daily.   Yes [provider]  docusate sodium  (COLACE) 100 MG capsule Take 1 capsule (100 mg total) by mouth 2 (two) times daily. 03/20/24  Yes Dancy, Alan, PA-C  estradiol (ESTRACE) 0.1 MG/GM vaginal cream Place 2 g vaginally daily as needed (vaginal itching).   Yes [provider]  HYDROcodone -acetaminophen  (NORCO/VICODIN) 5-325 MG tablet Take 1-2 tablets by mouth every 6 (six) hours as needed for moderate pain (pain score 4-6) or severe pain (pain score 7-10). 03/20/24  Yes Dancy, Alan, PA-C  hydrOXYzine  (ATARAX ) 25 MG tablet Take 25 mg by mouth in the morning and at bedtime.   Yes [provider]  ketoconazole (NIZORAL) 2 % cream Apply 1 Application topically daily.   Yes [provider]  losartan -hydrochlorothiazide  (HYZAAR) 100-12.5 MG tablet Take 1 tablet by mouth daily. 01/02/24  Yes [provider]  montelukast  (SINGULAIR ) 10 MG tablet Take 10 mg by mouth every morning.   Yes [provider]  Polyethyl Glycol-Propyl Glycol (SYSTANE) 0.4-0.3 % GEL ophthalmic gel Place 1 Application into both eyes 3 (three) times daily as needed (dry eyes).   Yes [provider]  rosuvastatin  (CRESTOR ) 5 MG  tablet Take 5 mg by mouth every evening.   Yes [provider]  tiZANidine  (ZANAFLEX ) 4 MG capsule Take 4 mg by mouth 3 (three) times daily as needed for muscle spasms.   Yes [provider]  triamcinolone  cream (KENALOG ) 0.1 % Apply 1 Application topically daily at 12 noon. 01/23/24  Yes [provider]  ursodiol  (ACTIGALL ) 500 MG tablet Take 500 mg by mouth 2 (two) times daily.   Yes [provider]  VENTOLIN  HFA 108 (90 Base) MCG/ACT inhaler Inhale 1-2 puffs into the lungs every 4 (four) hours as needed for shortness of breath.  02/06/16  Yes [provider]    Physical Exam: BP (!) 104/55   Pulse (!) 56   Temp 98.4 F (36.9 C) (Oral)   Resp 18   Ht 5' 6 (1.676 m)   Wt 128.8 kg   SpO2 94%   BMI 45.83 kg/m   General:  Alert, oriented, calm,  in no acute distress  Eyes: EOMI, clear conjuctivae, white sclerea Neck: supple, no masses, trachea mildline  Cardiovascular: RRR, no murmurs or rubs, no peripheral edema  Respiratory: clear to auscultation bilaterally, no wheezes, no crackles  Abdomen: soft, nontender, nondistended, normal bowel tones heard  Skin: dry, no rashes  Musculoskeletal: no joint effusions, normal range of motion  Psychiatric: appropriate affect, normal speech  Neurologic: extraocular muscles intact, clear speech, moving all extremities with intact sensorium         Recent Labs and Imaging Reviewed:  Basic Metabolic Panel: Recent Labs  Lab 03/21/24 0736  NA 138  K 4.3  CL 106  CO2 20*  GLUCOSE 112*  BUN 13  CREATININE 1.01*  CALCIUM  8.8*   Liver Function Tests: No results for input(s): AST, ALT, ALKPHOS, BILITOT, PROT, ALBUMIN in the last 168 hours. No results for input(s): LIPASE, AMYLASE in the last 168 hours. No results for input(s): AMMONIA in the last 168 hours. CBC: Recent Labs  Lab 03/20/24 1225 03/21/24 0736  HGB 12.3 11.2*  HCT 39.6 35.0*   Cardiac Enzymes: No results for  input(s): CKTOTAL, CKMB, CKMBINDEX, TROPONINI in the last 168 hours.  BNP (last 3 results) No results for input(s): BNP in the last 8760 hours.  ProBNP (last 3 results) No results for input(s): PROBNP in the last 8760 hours.  CBG: Recent Labs  Lab 03/20/24 0910 03/20/24 1229  GLUCAP 111* 132*    Radiological Exams on Admission: DG C-Arm 1-60 Min-No Report Result Date: 03/20/2024 Fluoroscopy was utilized by the requesting physician.  No radiographic interpretation.    Summary and Recommendations: Samantha Park is a 63 y.o. female with medical history significant for asthma, hypertension who underwent uncomplicated robotic assisted pyeloplasty with Dr. Alvaro on 9/12 and is now being seen for medical consult for palpitations.  Currently she is in normal sinus rhythm, she had palpitations without complicating or concerning factors.  Electrolytes and vital signs are unremarkable. -Follow-up twelve-lead EKG -Placed on telemetry monitor -Check magnesium  level -Patient is mildly hypotensive, will hold losartan /HCTZ today -No plan for further workup unless development of chest pain, persistent arrhythmia, etc.  Thank you for involving us  in the care of your patient.  [facility] will continue to follow along with you.    Code Status: Full Code  Time spent: 42 minutes  Samantha Pyon CHRISTELLA Gail MD [facility] Pager [phone]  If 7PM-7AM, please contact night-coverage [email or web address] Password [email or web address]  03/21/2024, 11:13 AM

## 2024-03-21 NOTE — Plan of Care (Signed)
  Problem: Education: Goal: Knowledge of General Education information will improve Description: Including pain rating scale, medication(s)/side effects and non-pharmacologic comfort measures Outcome: Progressing   Problem: Health Behavior/Discharge Planning: Goal: Ability to manage health-related needs will improve Outcome: Progressing   Problem: Clinical Measurements: Goal: Ability to maintain clinical measurements within normal limits will improve Outcome: Progressing Goal: Will remain free from infection Outcome: Progressing Goal: Diagnostic test results will improve Outcome: Progressing Goal: Respiratory complications will improve Outcome: Progressing Goal: Cardiovascular complication will be avoided Outcome: Progressing   Problem: Activity: Goal: Risk for activity intolerance will decrease Outcome: Progressing   Problem: Nutrition: Goal: Adequate nutrition will be maintained Outcome: Progressing   Problem: Coping: Goal: Level of anxiety will decrease Outcome: Progressing   Problem: Elimination: Goal: Will not experience complications related to bowel motility Outcome: Progressing Goal: Will not experience complications related to urinary retention Outcome: Progressing   Problem: Pain Managment: Goal: General experience of comfort will improve and/or be controlled Outcome: Progressing   Problem: Safety: Goal: Ability to remain free from injury will improve Outcome: Progressing   Problem: Skin Integrity: Goal: Risk for impaired skin integrity will decrease Outcome: Progressing   Problem: Education: Goal: Knowledge of the prescribed therapeutic regimen will improve Outcome: Progressing   Problem: Bowel/Gastric: Goal: Gastrointestinal status for postoperative course will improve Outcome: Progressing   Problem: Clinical Measurements: Goal: Postoperative complications will be avoided or minimized Outcome: Progressing   Problem: Respiratory: Goal:  Ability to achieve and maintain a regular respiratory rate will improve Outcome: Progressing   Problem: Skin Integrity: Goal: Demonstration of wound healing without infection will improve Outcome: Progressing   Problem: Urinary Elimination: Goal: Ability to avoid or minimize complications of infection will improve Outcome: Progressing Goal: Ability to achieve and maintain urine output will improve Outcome: Progressing

## 2024-03-21 NOTE — Progress Notes (Addendum)
   Subjective Awakened with palpitations this morning.  EKG obtained however palpitations had resolved.  Ambulating, tolerating clear liquids   Physical Exam: BP (!) 104/55   Pulse (!) 56   Temp 98.4 F (36.9 C) (Oral)   Resp 18   Ht 5' 6 (1.676 m)   Wt 128.8 kg   SpO2 94%   BMI 45.83 kg/m    Intake/Output from previous day: 09/12 0701 - 09/13 0700 In: 2716 [P.O.:120; I.V.:2446; IV Piggyback:150] Out: 1050 [Urine:1000; Drains:30; Blood:20]   Constitutional:  Alert and oriented, No acute distress. Respiratory: Normal respiratory effort, no increased work of breathing. GI: Abdomen is soft, mild incisional tenderness.  Incisions clean/dry without erythema Drains: Scant serosanguineous   Lab Results:  Recent Labs    03/20/24 1225 03/21/24 0736  HGB 12.3 11.2*  HCT 39.6 35.0*   BMET Recent Labs    03/21/24 0736  NA 138  K 4.3  CL 106  CO2 20*  GLUCOSE 112*  BUN 13  CREATININE 1.01*  CALCIUM  8.8*     Assessment & Plan:   Doing well status post left robotic pyeloplasty/renal cyst decortication Episode of palpitations this morning-Will ask hospitalist to evaluate Advance diet DC Foley DC JP   Samantha JAYSON Barba, MD

## 2024-03-22 ENCOUNTER — Observation Stay (HOSPITAL_COMMUNITY)

## 2024-03-22 DIAGNOSIS — N135 Crossing vessel and stricture of ureter without hydronephrosis: Secondary | ICD-10-CM | POA: Diagnosis not present

## 2024-03-22 LAB — CBC WITH DIFFERENTIAL/PLATELET
Abs Immature Granulocytes: 0.09 K/uL — ABNORMAL HIGH (ref 0.00–0.07)
Basophils Absolute: 0 K/uL (ref 0.0–0.1)
Basophils Relative: 0 %
Eosinophils Absolute: 0 K/uL (ref 0.0–0.5)
Eosinophils Relative: 0 %
HCT: 37.3 % (ref 36.0–46.0)
Hemoglobin: 11.8 g/dL — ABNORMAL LOW (ref 12.0–15.0)
Immature Granulocytes: 1 %
Lymphocytes Relative: 15 %
Lymphs Abs: 2 K/uL (ref 0.7–4.0)
MCH: 29.8 pg (ref 26.0–34.0)
MCHC: 31.6 g/dL (ref 30.0–36.0)
MCV: 94.2 fL (ref 80.0–100.0)
Monocytes Absolute: 1.5 K/uL — ABNORMAL HIGH (ref 0.1–1.0)
Monocytes Relative: 11 %
Neutro Abs: 9.9 K/uL — ABNORMAL HIGH (ref 1.7–7.7)
Neutrophils Relative %: 73 %
Platelets: 195 K/uL (ref 150–400)
RBC: 3.96 MIL/uL (ref 3.87–5.11)
RDW: 12.8 % (ref 11.5–15.5)
WBC: 13.6 K/uL — ABNORMAL HIGH (ref 4.0–10.5)
nRBC: 0 % (ref 0.0–0.2)

## 2024-03-22 LAB — URINALYSIS, COMPLETE (UACMP) WITH MICROSCOPIC
Bilirubin Urine: NEGATIVE
Glucose, UA: NEGATIVE mg/dL
Ketones, ur: NEGATIVE mg/dL
Nitrite: NEGATIVE
Protein, ur: NEGATIVE mg/dL
RBC / HPF: >50 RBC/hpf (ref 0–5)
Specific Gravity, Urine: 1.005 (ref 1.005–1.030)
pH: 8 (ref 5.0–8.0)

## 2024-03-22 LAB — BASIC METABOLIC PANEL WITH GFR
Anion gap: 11 (ref 5–15)
BUN: 11 mg/dL (ref 8–23)
CO2: 24 mmol/L (ref 22–32)
Calcium: 9.1 mg/dL (ref 8.9–10.3)
Chloride: 104 mmol/L (ref 98–111)
Creatinine, Ser: 1.02 mg/dL — ABNORMAL HIGH (ref 0.44–1.00)
GFR, Estimated: >60 mL/min (ref >60)
Glucose, Bld: 147 mg/dL — ABNORMAL HIGH (ref 70–99)
Potassium: 4.3 mmol/L (ref 3.5–5.1)
Sodium: 139 mmol/L (ref 135–145)

## 2024-03-22 MED ORDER — IOHEXOL 300 MG/ML  SOLN
100.0000 mL | Freq: Once | INTRAMUSCULAR | Status: AC | PRN
  Administered 2024-03-22: 100 mL via INTRAVENOUS

## 2024-03-22 NOTE — Plan of Care (Signed)
  Problem: Education: Goal: Knowledge of General Education information will improve Description: Including pain rating scale, medication(s)/side effects and non-pharmacologic comfort measures Outcome: Progressing   Problem: Health Behavior/Discharge Planning: Goal: Ability to manage health-related needs will improve Outcome: Progressing   Problem: Clinical Measurements: Goal: Ability to maintain clinical measurements within normal limits will improve Outcome: Progressing Goal: Will remain free from infection Outcome: Progressing Goal: Diagnostic test results will improve Outcome: Progressing Goal: Respiratory complications will improve Outcome: Progressing Goal: Cardiovascular complication will be avoided Outcome: Progressing   Problem: Activity: Goal: Risk for activity intolerance will decrease Outcome: Progressing   Problem: Nutrition: Goal: Adequate nutrition will be maintained Outcome: Progressing   Problem: Coping: Goal: Level of anxiety will decrease Outcome: Progressing   Problem: Elimination: Goal: Will not experience complications related to bowel motility Outcome: Progressing Goal: Will not experience complications related to urinary retention Outcome: Progressing   Problem: Pain Managment: Goal: General experience of comfort will improve and/or be controlled Outcome: Progressing   Problem: Safety: Goal: Ability to remain free from injury will improve Outcome: Progressing   Problem: Skin Integrity: Goal: Risk for impaired skin integrity will decrease Outcome: Progressing   Problem: Education: Goal: Knowledge of the prescribed therapeutic regimen will improve Outcome: Progressing   Problem: Bowel/Gastric: Goal: Gastrointestinal status for postoperative course will improve Outcome: Progressing   Problem: Clinical Measurements: Goal: Postoperative complications will be avoided or minimized Outcome: Progressing   Problem: Respiratory: Goal:  Ability to achieve and maintain a regular respiratory rate will improve Outcome: Progressing   Problem: Skin Integrity: Goal: Demonstration of wound healing without infection will improve Outcome: Progressing   Problem: Urinary Elimination: Goal: Ability to avoid or minimize complications of infection will improve Outcome: Progressing Goal: Ability to achieve and maintain urine output will improve Outcome: Progressing

## 2024-03-22 NOTE — Progress Notes (Signed)
   POD #2  Subjective Temp spike 102.9 2330 last night.  99.8 this a.m. Mild cough.  No shortness of breath/chest pain. Mild bilateral back pain.  No bothersome lower urinary tract symptoms.  No recurrent palpitations and no significant abnormalities noted on hospitalist evaluation.  Was placed on telemetry without significant abnormalities overnight   Physical Exam: BP 122/65   Pulse 81   Temp 99.8 F (37.7 C) (Oral)   Resp 16   Ht 5' 6 (1.676 m)   Wt 128.8 kg   SpO2 93%   BMI 45.83 kg/m    Intake/Output from previous day: 09/13 0701 - 09/14 0700 In: 1012.8 [P.O.:200; I.V.:812.8] Out: 5 [Drains:5]   Constitutional:  Alert and oriented, No acute distress. Respiratory: Normal respiratory effort, no increased work of breathing. GI: Abdomen is soft, mild incisional tenderness   Lab Results:  Recent Labs    03/21/24 0736 03/22/24 1216  WBC  --  13.6*  HGB 11.2* 11.8*  HCT 35.0* 37.3  PLT  --  195   BMET Recent Labs    03/21/24 0736  NA 138  K 4.3  CL 106  CO2 20*  GLUCOSE 112*  BUN 13  CREATININE 1.01*  CALCIUM  8.8*    Assessment & Plan:   Status post left robotic pyeloplasty/renal cyst decortication Temp spike 102.9 Chest x-ray ordered UA/urine C&S, blood culture CT chest/abdomen/pelvis with contrast   Samantha JAYSON Barba, MD

## 2024-03-22 NOTE — Progress Notes (Signed)
 PROGRESS NOTE    Samantha Park  FMW:[id number] DOB: [birth year] DOA: 03/20/2024 PCP: Aisha Harvey, MD    Brief Narrative:   Samantha Park is a 63 y.o. female with past medical history significant for HTN, HLD, asthma, nephrolithiasis, moderate left hydronephrosis secondary to left renal cyst with associated partial UPJ obstruction admit by urology for cystoscopy with pyeloplasty, decortication of left renal cyst with ureteral stent placement on 9/12.  Patient reports recovering well from her surgery, ambulating halls without difficulty.  After awakening from a nap, reports sensation of palpitations lasting approximately 15-30 seconds.  No associated shortness of breath, dizziness or chest pain.  Resolved without intervention. Hospitalist service consulted for palpitations.  Assessment & Plan:   Palpitations likely secondary to intermittent PVCs Patient reports episode of palpitations lasting 15 to 30 seconds, subsided without intervention.  Denies dizziness, no chest pain, no shortness of breath.  She reports earlier in the summer with episode of dizziness that self subsided.  Patient denies chest pain, no shortness of breath or no dizziness currently.  EKG showing sinus bradycardia with no concerning findings.  Was monitored on telemetry, review notable for occasional PVCs; no other arrhythmias such as PAT, atrial fibrillation/flutter, SVT noted on review.  No previous history of arrhythmia.    No further recommendations at this time.  If has recurrence of symptoms, recommend outpatient referral to cardiology for consideration of Holter monitor.  Essential hypertension At baseline on combination losartan -HCTZ 100-12.5 mg p.o. daily.  Postoperatively with borderline hypotension, BP this morning 122/65.  Recommend to continue to hold losartan -HCTZ on discharge unless systolic blood pressure greater 130.  Outpatient follow-up with PCP for further guidance upon restarting  Postoperative  fever Overnight patient with temperature 102.9 F.  Received Tylenol , temperature currently 99.8 F.  Reports blood in her urine with slightly increased frequency, although had recent manipulation of her GU tract secondary to decortication of left renal cyst with stent placement on 9/12.  Denies shortness of breath, no cough.  No rashes/lesions/wounds.  Defer further management to primary service, urology  HLD: -- Continue Crestor  5 mg p.o. daily  Asthma -- Continue Breztri , Singulair  -- Butyryl neb every 4 hours.  Shortness of breath  Obesity, class III Body mass index is 45.83 kg/m.   DVT prophylaxis: SCDs Start: 03/20/24 1359    Code Status: Full Code Family Communication: Updated spouse present at bedside this morning  Disposition Plan:  Level of care: Med-Surg Status is: Observation   Subjective: Patient seen examined bedside, lying in bed.  Reports blood in her urine, denies clots.  Spouse present at bedside.  Fever overnight, 102.9 F.  Reports blood in urine, no clots; increased frequency of urination no further palpitations.  Review of telemetry shows occasional PVCs, no other concerning arrhythmias.  Reviewed EKG, sinus bradycardia.  Patient with no other specific complaints, concerns or questions at this time.  Denies headache, no dizziness, no visual changes, no chest pain, no palpitations currently, no current fever, no chills/night sweats, no nausea/vomiting/diarrhea, no focal weakness, no fatigue, no paresthesias.  No other acute events overnight per nursing.  Objective: Vitals:   03/22/24 0000 03/22/24 0255 03/22/24 0400 03/22/24 0524  BP:    122/65  Pulse:    81  Resp: 19 20 20 16   Temp:    99.8 F (37.7 C)  TempSrc:    Oral  SpO2:    93%  Weight:      Height:        Intake/Output  Summary (Last 24 hours) at 03/22/2024 1022 Last data filed at 03/21/2024 2000 Gross per 24 hour  Intake 1012.82 ml  Output 5 ml  Net 1007.82 ml   Filed Weights   03/20/24  0923 03/20/24 1400  Weight: 125.2 kg 128.8 kg    Examination:  Physical Exam: GEN: NAD, alert and oriented x 3, obese HEENT: NCAT, PERRL, EOMI, sclera clear, MMM PULM: CTAB w/o wheezes/crackles, normal respiratory effort, on room air CV: RRR w/o M/G/R GI: abd soft, NTND, + BS MSK: no peripheral edema, moves all extremities independently NEURO: No focal neurological deficit PSYCH: normal mood/affect Integumentary: No concerning rashes/lesions/wounds noted on exposed skin surfaces    Data Reviewed: I have personally reviewed following labs and imaging studies  CBC: Recent Labs  Lab 03/20/24 1225 03/21/24 0736  HGB 12.3 11.2*  HCT 39.6 35.0*   Basic Metabolic Panel: Recent Labs  Lab 03/21/24 0736  NA 138  K 4.3  CL 106  CO2 20*  GLUCOSE 112*  BUN 13  CREATININE 1.01*  CALCIUM  8.8*  MG 2.2   GFR: Estimated Creatinine Clearance: 78.4 mL/min (A) (by C-G formula based on SCr of 1.01 mg/dL (H)). Liver Function Tests: No results for input(s): AST, ALT, ALKPHOS, BILITOT, PROT, ALBUMIN in the last 168 hours. No results for input(s): LIPASE, AMYLASE in the last 168 hours. No results for input(s): AMMONIA in the last 168 hours. Coagulation Profile: No results for input(s): INR, PROTIME in the last 168 hours. Cardiac Enzymes: No results for input(s): CKTOTAL, CKMB, CKMBINDEX, TROPONINI in the last 168 hours. BNP (last 3 results) No results for input(s): PROBNP in the last 8760 hours. HbA1C: No results for input(s): HGBA1C in the last 72 hours. CBG: Recent Labs  Lab 03/20/24 0910 03/20/24 1229  GLUCAP 111* 132*   Lipid Profile: No results for input(s): CHOL, HDL, LDLCALC, TRIG, CHOLHDL, LDLDIRECT in the last 72 hours. Thyroid Function Tests: No results for input(s): TSH, T4TOTAL, FREET4, T3FREE, THYROIDAB in the last 72 hours. Anemia Panel: No results for input(s): VITAMINB12, FOLATE, FERRITIN, TIBC,  IRON, RETICCTPCT in the last 72 hours. Sepsis Labs: No results for input(s): PROCALCITON, LATICACIDVEN in the last 168 hours.  No results found for this or any previous visit (from the past 240 hours).       Radiology Studies: DG C-Arm 1-60 Min-No Report Result Date: 03/20/2024 Fluoroscopy was utilized by the requesting physician.  No radiographic interpretation.        Scheduled Meds:  budesonide -glycopyrrolate -formoterol   2 puff Inhalation BID   docusate sodium   100 mg Oral BID   hydrOXYzine   25 mg Oral BID   montelukast   10 mg Oral q morning   rosuvastatin   5 mg Oral QPM   ursodiol   300 mg Oral BID   Continuous Infusions:   LOS: 0 days    Time spent: 45 minutes spent on 03/22/2024 caring for this patient face-to-face including chart review, ordering labs/tests, documenting, discussion with nursing staff, consultants, updating family and interview/physical exam    Samantha PARAS [place], DO [facility] Available via Epic secure chat 7am-7pm After these hours, please refer to coverage provider listed on [email or web address] 03/22/2024, 10:22 AM

## 2024-03-23 ENCOUNTER — Other Ambulatory Visit (HOSPITAL_COMMUNITY): Payer: Self-pay

## 2024-03-23 DIAGNOSIS — N135 Crossing vessel and stricture of ureter without hydronephrosis: Secondary | ICD-10-CM | POA: Diagnosis not present

## 2024-03-23 LAB — CBC
HCT: 38.7 % (ref 36.0–46.0)
Hemoglobin: 12.1 g/dL (ref 12.0–15.0)
MCH: 29.3 pg (ref 26.0–34.0)
MCHC: 31.3 g/dL (ref 30.0–36.0)
MCV: 93.7 fL (ref 80.0–100.0)
Platelets: 186 K/uL (ref 150–400)
RBC: 4.13 MIL/uL (ref 3.87–5.11)
RDW: 12.8 % (ref 11.5–15.5)
WBC: 11.3 K/uL — ABNORMAL HIGH (ref 4.0–10.5)
nRBC: 0 % (ref 0.0–0.2)

## 2024-03-23 LAB — COMPREHENSIVE METABOLIC PANEL WITH GFR
ALT: 25 U/L (ref 0–44)
AST: 31 U/L (ref 15–41)
Albumin: 3.5 g/dL (ref 3.5–5.0)
Alkaline Phosphatase: 144 U/L — ABNORMAL HIGH (ref 38–126)
Anion gap: 12 (ref 5–15)
BUN: 13 mg/dL (ref 8–23)
CO2: 24 mmol/L (ref 22–32)
Calcium: 9.3 mg/dL (ref 8.9–10.3)
Chloride: 103 mmol/L (ref 98–111)
Creatinine, Ser: 0.94 mg/dL (ref 0.44–1.00)
GFR, Estimated: >60 mL/min (ref >60)
Glucose, Bld: 103 mg/dL — ABNORMAL HIGH (ref 70–99)
Potassium: 4 mmol/L (ref 3.5–5.1)
Sodium: 138 mmol/L (ref 135–145)
Total Bilirubin: 1 mg/dL (ref 0.0–1.2)
Total Protein: 6.7 g/dL (ref 6.5–8.1)

## 2024-03-23 LAB — URINE CULTURE: Culture: <10000 — AB

## 2024-03-23 LAB — SURGICAL PATHOLOGY

## 2024-03-23 LAB — D-DIMER, QUANTITATIVE: D-Dimer, Quant: 1.67 ug{FEU}/mL — ABNORMAL HIGH (ref 0.00–0.50)

## 2024-03-23 MED ORDER — HEPARIN SODIUM (PORCINE) 5000 UNIT/ML IJ SOLN
5000.0000 [IU] | Freq: Three times a day (TID) | INTRAMUSCULAR | Status: DC
  Administered 2024-03-23 – 2024-03-25 (×6): 5000 [IU] via SUBCUTANEOUS
  Filled 2024-03-23 (×6): qty 1

## 2024-03-23 MED ORDER — DOXYCYCLINE HYCLATE 100 MG PO TABS
100.0000 mg | ORAL_TABLET | Freq: Two times a day (BID) | ORAL | Status: DC
  Administered 2024-03-23 – 2024-03-25 (×4): 100 mg via ORAL
  Filled 2024-03-23 (×4): qty 1

## 2024-03-23 MED ORDER — PIPERACILLIN-TAZOBACTAM 3.375 G IVPB
3.3750 g | Freq: Three times a day (TID) | INTRAVENOUS | Status: DC
  Administered 2024-03-23 – 2024-03-25 (×5): 3.375 g via INTRAVENOUS
  Filled 2024-03-23 (×6): qty 50

## 2024-03-23 MED ORDER — SODIUM CHLORIDE 0.9 % IV SOLN
100.0000 mg | Freq: Two times a day (BID) | INTRAVENOUS | Status: DC
  Filled 2024-03-23: qty 100

## 2024-03-23 MED ORDER — PIPERACILLIN-TAZOBACTAM 3.375 G IVPB
3.3750 g | Freq: Three times a day (TID) | INTRAVENOUS | Status: DC
  Administered 2024-03-23: 3.375 g via INTRAVENOUS
  Filled 2024-03-23 (×2): qty 50

## 2024-03-23 NOTE — Progress Notes (Signed)
   03/23/24 0858  TOC Brief Assessment  Insurance and Status Reviewed  Patient has primary care physician Yes  Home environment has been reviewed Resides in single family home with spouse  Prior level of function: Independent with ADLs at baseline  Prior/Current Home Services No current home services  Social Drivers of Health Review SDOH reviewed no interventions necessary  Readmission risk has been reviewed Yes  Transition of care needs no transition of care needs at this time

## 2024-03-23 NOTE — Progress Notes (Signed)
 PROGRESS NOTE    Samantha Park  FMW:[id number] DOB: 04-Jun-1961 DOA: 03/20/2024 PCP: Aisha Harvey, MD    Brief Narrative:   Samantha Park is a 63 y.o. female with past medical history significant for HTN, HLD, asthma, nephrolithiasis, moderate left hydronephrosis secondary to left renal cyst with associated partial UPJ obstruction admit by urology for cystoscopy with pyeloplasty, decortication of left renal cyst with ureteral stent placement on 9/12.  Patient reports recovering well from her surgery, ambulating halls without difficulty.  After awakening from a nap, reports sensation of palpitations lasting approximately 15-30 seconds.  No associated shortness of breath, dizziness or chest pain.  Resolved without intervention. Hospitalist service consulted for palpitations.  Assessment & Plan:   Palpitations likely secondary to intermittent PVCs Patient reports episode of palpitations lasting 15 to 30 seconds, subsided without intervention.  Denies dizziness, no chest pain, no shortness of breath.  She reports earlier in the summer with episode of dizziness that self subsided.  Patient denies chest pain, no shortness of breath or no dizziness currently.  EKG showing sinus bradycardia with no concerning findings.  Review of telemetry earlier in hospitalization, review notable for occasional PVCs; no other arrhythmias such as PAT, atrial fibrillation/flutter, SVT noted on review.  No previous history of arrhythmia.  Telemetry has now been discontinued.  No further recommendations at this time.  If has recurrence of symptoms, recommend outpatient referral to cardiology for consideration of Holter monitor.  Essential hypertension At baseline on combination losartan -HCTZ 100-12.5 mg p.o. daily.  Postoperatively with borderline hypotension, BP this morning 125/54.  Recommend to continue to hold losartan -HCTZ on discharge unless systolic blood pressure greater 130.  Outpatient follow-up with PCP for  further guidance upon restarting  Postoperative fever Overnight patient with temperature 102.9 F.  Received Tylenol , temperature currently 99.8 F.  Reports blood in her urine with slightly increased frequency, although had recent manipulation of her GU tract secondary to decortication of left renal cyst with stent placement on 9/12.  Denies shortness of breath, no cough.  No rashes/lesions/wounds.  Urinalysis unrevealing.  WBC count 13.6 on 9/14.  CT chest/abdomen/pelvis with questionable pericholecystic fluid/gallbladder wall thickening, although no right upper quadrant tenderness or pain with oral intake. -- Tmax 100.7 past 24 hours -- Urine culture/blood cultures: Pending -- Started on Zosyn  per primary team  Defer further management to primary service, urology  HLD: -- Continue Crestor  5 mg p.o. daily  Asthma -- Continue Breztri , Singulair  --  albuterol  neb every 4 hours.  Shortness of breath  Pulmonary nodule CT chest with right upper lobe subpleural ground glass opacity measuring 1.1 cm.  Recommend repeat CT chest outpatient 60-month  Obesity, class III Body mass index is 45.83 kg/m.   DVT prophylaxis: SCDs Start: 03/20/24 1359    Code Status: Full Code Family Communication: Updated spouse present at bedside this morning  Disposition Plan:  Level of care: Med-Surg Status is: Observation   Subjective: Patient seen examined bedside, lying in bed.  Patient reports some bilateral flank pain, continued blood in her urine without clots.  Fever curve improving, Tmax 100.7 F past 24 hours.  CT chest/abdomen/pelvis relatively unrevealing.  Urinalysis unrevealing.  Pending blood/urine culture.  Started on Zosyn  per primary team.  Telemetry has now been discontinued, denies any further palpitations.  Patient with no other specific complaints, concerns or questions at this time.  Denies headache, no dizziness, no visual changes, no chest pain, no palpitations currently, no current  fever, no chills/night sweats, no nausea/vomiting/diarrhea,  no focal weakness, no fatigue, no paresthesias.  No other acute events overnight per nursing.  Objective: Vitals:   03/23/24 0132 03/23/24 0133 03/23/24 0135 03/23/24 0553  BP: (!) 89/53 (!) 88/51 (!) 114/57 (!) 125/54  Pulse: 60 64  68  Resp: 18 18  20   Temp: 98.3 F (36.8 C) 98.3 F (36.8 C)  99 F (37.2 C)  TempSrc: Oral Oral  Oral  SpO2: 96% 96%  97%  Weight:      Height:        Intake/Output Summary (Last 24 hours) at 03/23/2024 1114 Last data filed at 03/23/2024 9061 Gross per 24 hour  Intake 490 ml  Output 2750 ml  Net -2260 ml   Filed Weights   03/20/24 0923 03/20/24 1400  Weight: 125.2 kg 128.8 kg    Examination:  Physical Exam: GEN: NAD, alert and oriented x 3, obese HEENT: NCAT, PERRL, EOMI, sclera clear, MMM PULM: CTAB w/o wheezes/crackles, normal respiratory effort, on room air CV: RRR w/o M/G/R GI: abd soft, NTND, + BS MSK: no peripheral edema, moves all extremities independently NEURO: No focal neurological deficit PSYCH: normal mood/affect Integumentary: No concerning rashes/lesions/wounds noted on exposed skin surfaces    Data Reviewed: I have personally reviewed following labs and imaging studies  CBC: Recent Labs  Lab 03/20/24 1225 03/21/24 0736 03/22/24 1216 03/23/24 0711  WBC  --   --  13.6* 11.3*  NEUTROABS  --   --  9.9*  --   HGB 12.3 11.2* 11.8* 12.1  HCT 39.6 35.0* 37.3 38.7  MCV  --   --  94.2 93.7  PLT  --   --  195 186   Basic Metabolic Panel: Recent Labs  Lab 03/21/24 0736 03/22/24 1216 03/23/24 0712  NA 138 139 138  K 4.3 4.3 4.0  CL 106 104 103  CO2 20* 24 24  GLUCOSE 112* 147* 103*  BUN 13 11 13   CREATININE 1.01* 1.02* 0.94  CALCIUM  8.8* 9.1 9.3  MG 2.2  --   --    GFR: Estimated Creatinine Clearance: 84.2 mL/min (by C-G formula based on SCr of 0.94 mg/dL). Liver Function Tests: Recent Labs  Lab 03/23/24 0712  AST 31  ALT 25  ALKPHOS 144*   BILITOT 1.0  PROT 6.7  ALBUMIN 3.5   No results for input(s): LIPASE, AMYLASE in the last 168 hours. No results for input(s): AMMONIA in the last 168 hours. Coagulation Profile: No results for input(s): INR, PROTIME in the last 168 hours. Cardiac Enzymes: No results for input(s): CKTOTAL, CKMB, CKMBINDEX, TROPONINI in the last 168 hours. BNP (last 3 results) No results for input(s): PROBNP in the last 8760 hours. HbA1C: No results for input(s): HGBA1C in the last 72 hours. CBG: Recent Labs  Lab 03/20/24 0910 03/20/24 1229  GLUCAP 111* 132*   Lipid Profile: No results for input(s): CHOL, HDL, LDLCALC, TRIG, CHOLHDL, LDLDIRECT in the last 72 hours. Thyroid Function Tests: No results for input(s): TSH, T4TOTAL, FREET4, T3FREE, THYROIDAB in the last 72 hours. Anemia Panel: No results for input(s): VITAMINB12, FOLATE, FERRITIN, TIBC, IRON, RETICCTPCT in the last 72 hours. Sepsis Labs: No results for input(s): PROCALCITON, LATICACIDVEN in the last 168 hours.  Recent Results (from the past 240 hours)  Culture, blood (Routine X 2) w Reflex to ID Panel     Status: None (Preliminary result)   Collection Time: 03/22/24  1:30 PM   Specimen: BLOOD RIGHT ARM  Result Value Ref Range Status  Specimen Description   Final    BLOOD RIGHT ARM Performed at [facility], [address] [place], [place] [address]    Special Requests   Final    BOTTLES DRAWN AEROBIC AND ANAEROBIC Blood Culture results may not be optimal due to an inadequate volume of blood received in culture bottles Performed at [facility], [address], [place], [place] [address]    Culture   Final    NO GROWTH < 24 HOURS Performed at [facility], [address] [place], [place] [address]    Report Status PENDING  Incomplete  Culture, blood (Routine X 2) w Reflex to ID Panel     Status: None (Preliminary result)    Collection Time: 03/22/24  1:30 PM   Specimen: BLOOD LEFT ARM  Result Value Ref Range Status   Specimen Description   Final    BLOOD LEFT ARM Performed at [facility], [address], [place], [place] [address]    Special Requests   Final    BOTTLES DRAWN AEROBIC AND ANAEROBIC Blood Culture results may not be optimal due to an inadequate volume of blood received in culture bottles Performed at [facility], [address], [place], [place] [address]    Culture   Final    NO GROWTH < 24 HOURS Performed at [facility], [address], [place], [place] [address]    Report Status PENDING  Incomplete         Radiology Studies: CT CHEST ABDOMEN PELVIS W CONTRAST Result Date: 03/22/2024 CLINICAL DATA:  Postop fever s/p robotic pyeloplasty. Cystic decortication evaluate for sources including urine leak EXAM: CT CHEST, ABDOMEN, AND PELVIS WITH CONTRAST TECHNIQUE: Multidetector CT imaging of the chest, abdomen and pelvis was performed following the standard protocol during bolus administration of intravenous contrast. RADIATION DOSE REDUCTION: This exam was performed according to the departmental dose-optimization program which includes automated exposure control, adjustment of the mA and/or kV according to patient size and/or use of iterative reconstruction technique. CONTRAST:  OMNIPAQUE  IOHEXOL  300 MG/ML  SOLN COMPARISON:  None Available. FINDINGS: CHEST: Cardiovascular: No aortic injury. The thoracic aorta is normal in caliber. The heart is normal in size. No significant pericardial effusion. The main pulmonary artery is normal in caliber. No central or segmental pulmonary embolus. Limited evaluation more distally due to timing of contrast. Next Mediastinum/Nodes: No pneumomediastinum. No mediastinal hematoma. The esophagus is unremarkable. The thyroid is unremarkable. The central airways are patent. No mediastinal, hilar, or axillary lymphadenopathy.  Nonspecific 0.8 cm precardiac lymph node (4:231). Lungs/Pleura: Bibasilar atelectasis. Right upper lobe subpleural ground-glass airspace opacity measuring up to 1.1 cm. No focal consolidation. No pulmonary mass. No pulmonary contusion or laceration. No pneumatocele formation. No pleural effusion. No pneumothorax. No hemothorax. Musculoskeletal/Chest wall: No chest wall mass. No acute rib or sternal fracture. No spinal fracture. ABDOMEN / PELVIS: Hepatobiliary: Not enlarged. Slightly nodular hepatic contour. No focal lesion. No laceration or subcapsular hematoma. No radio-opaque gallstones within the gallbladder lumen. Question gallbladder wall thickening. Nonspecific pericholecystic fluid. No biliary ductal dilatation. Pancreas: Normal pancreatic contour. No main pancreatic duct dilatation. Spleen: Not enlarged. No focal lesion. No laceration, subcapsular hematoma, or vascular injury. Adrenals/Urinary Tract: No nodularity bilaterally. Left ureteral stent in appropriate position. Non-enhancement of the left lower renal pole. No hydronephrosis. Interval resolution of left hydronephrosis. No contusion, laceration. Pericentimeter fluid density lesions within the bilateral kidneys likely represents simple renal cysts. Simple renal  cysts, in the absence of clinically indicated signs/symptoms, require no independent follow-up. No nephroureterolithiasis. No injury to the vascular structures or collecting systems. No hydroureter. Gas within the urinary bladder lumen likely postsurgical in etiology. Otherwise the urinary bladder is unremarkable. Left calyx demonstrates cluster of multiple foci of gas as well as filling defect on delayed imaging-likely postsurgical in etiology in the setting of Surgicel. On delayed imaging, there is no extravasation of intravenous contrast on delayed view. Stomach/Bowel: No small or large bowel wall thickening or dilatation. Colonic diverticulosis. The appendix is unremarkable.  Vasculature/Lymphatics: No abdominal aorta or iliac aneurysm. No active contrast extravasation or pseudoaneurysm. No abdominal, pelvic, inguinal lymphadenopathy. Reproductive: Globular appearing uterus with underlying 6.8 cm fibroid. Otherwise uterus grossly unremarkable. No adnexal mass. Other: No simple free fluid ascites. Scattered multiple foci of free gas throughout the left abdomen likely postsurgical in etiology. No hemoperitoneum. No mesenteric hematoma identified. No organized fluid collection. Musculoskeletal: No significant soft tissue hematoma. Subcutaneus soft tissue emphysema along the left chest wall, abdominal wall, leg likely postsurgical in etiology. No acute pelvic fracture. No spinal fracture. Other ports and devices: None. IMPRESSION: 1. No acute intrathoracic abnormality. 2. Non-enhancement of the left lower renal pole which may be due to infarction or developing hematoma. No active extravasation identified. Superimposed infection not excluded. 3. Left ureteral stent in appropriate position. 4. Postsurgical changes of the abdomen noted. Left calyx demonstrates cluster of multiple foci of gas as well as filling defect on delayed imaging-likely postsurgical in etiology in the setting of Surgicel 5. Subcutaneus soft tissue emphysema - postsurgical in etiology status post robotic surgery. Other imaging findings of potential clinical significance: 1. Question pericholecystic fluid and gallbladder wall thickening. Consider right upper quadrant ultrasound for further evaluation. 2. Right upper lobe subpleural ground-glass airspace opacity measuring up to 1.1 cm. Initial follow-up with CT at 6 months is recommended to confirm persistence. If persistent, repeat CT is recommended every 2 years until 5 years of stability has been established. This recommendation follows the consensus statement: Guidelines for Management of Incidental Pulmonary Nodules Detected on CT Images: From the Fleischner Society  2017; Radiology 2017; 284:228-243. 3. Cirrhosis. No focal liver lesions identified. Please note that liver protocol enhanced MR and CT are the most sensitive tests for the screening detection of hepatocellular carcinoma in the high risk setting of cirrhosis. 4. Colonic diverticulosis with no acute diverticulitis. 5. Uterine fibroid. 6.  Aortic Atherosclerosis (ICD10-I70.0). These results will be called to the ordering clinician or representative by the Radiologist Assistant, and communication documented in the PACS or [employer]. Electronically Signed   By: Morgane  Naveau M.D.   On: 03/22/2024 19:46   DG Chest 2 View Result Date: 03/22/2024 CLINICAL DATA:  Fever. EXAM: CHEST - 2 VIEW COMPARISON:  Chest radiograph dated 02/23/2013. FINDINGS: No focal consolidation, pleural effusion, pneumothorax. The cardiac silhouette is within normal limits. No acute osseous pathology. Lower cervical ACDF. IMPRESSION: No active cardiopulmonary disease. Electronically Signed   By: Vanetta Chou M.D.   On: 03/22/2024 14:15        Scheduled Meds:  budesonide -glycopyrrolate -formoterol   2 puff Inhalation BID   docusate sodium   100 mg Oral BID   hydrOXYzine   25 mg Oral BID   montelukast   10 mg Oral q morning   rosuvastatin   5 mg Oral QPM   ursodiol   300 mg Oral BID   Continuous Infusions:  piperacillin -tazobactam (ZOSYN )  IV 3.375 g (03/23/24 0938)     LOS: 0 days  Time spent: 45 minutes spent on 03/23/2024 caring for this patient face-to-face including chart review, ordering labs/tests, documenting, discussion with nursing staff, consultants, updating family and interview/physical exam    Camellia PARAS [place], DO [facility] Available via Epic secure chat 7am-7pm After these hours, please refer to coverage provider listed on [email or web address] 03/23/2024, 11:14 AM

## 2024-03-23 NOTE — Progress Notes (Signed)
 Mobility Specialist - Progress Note   03/23/24 1054  Mobility  Activity Ambulated with assistance  Level of Assistance Standby assist, set-up cues, supervision of patient - no hands on  Assistive Device Other (Comment) (IV Pole)  Distance Ambulated (ft) 200 ft  Range of Motion/Exercises Active  Activity Response Tolerated well  Mobility Referral Yes  Mobility visit 1 Mobility  Mobility Specialist Start Time (ACUTE ONLY) 1030  Mobility Specialist Stop Time (ACUTE ONLY) 1050  Mobility Specialist Time Calculation (min) (ACUTE ONLY) 20 min   Pt was found in bed and agreeable to ambulate after bathroom use. Pt c/o abdominal soreness. Returned to bed with all needs met. Call bell in reach and family at bedside. RN notified.   Erminio Leos,  Mobility Specialist Can be reached via Secure Chat

## 2024-03-23 NOTE — Progress Notes (Signed)
 Mobility Specialist - Progress Note   03/23/24 1340  Mobility  Activity Ambulated with assistance  Level of Assistance Standby assist, set-up cues, supervision of patient - no hands on  Assistive Device Other (Comment) (IV Pole)  Distance Ambulated (ft) 250 ft  Range of Motion/Exercises Active  Activity Response Tolerated well  Mobility Referral Yes  Mobility visit 1 Mobility  Mobility Specialist Start Time (ACUTE ONLY) 1325  Mobility Specialist Stop Time (ACUTE ONLY) 1340  Mobility Specialist Time Calculation (min) (ACUTE ONLY) 15 min   Pt was found sitting EOB wanting to ambulate. Pt stated having labored breathing with session, coughing and abdominal soreness. At EOS returned to bed with all needs met. Call bell in reach and RN notified.   Erminio Leos,  Mobility Specialist Can be reached via Secure Chat

## 2024-03-23 NOTE — Progress Notes (Signed)
   POD #3  Interval Events Had low grade fevers through night, 99 this AM though was up to 100.7 at midnight. Endorsing chills. Cultures pending. Labs pending.   No SOB, some BL flank pain. Drain site dry. Abdomen soft with minimal bruising and mild TTP along left flank. Voiding every 3 hours with some gross hematuria, no dysuria. Ambulating only minimally.   Physical Exam: BP (!) 125/54 (BP Location: Right Arm)   Pulse 68   Temp 99 F (37.2 C) (Oral)   Resp 20   Ht 5' 6 (1.676 m)   Wt 128.8 kg   SpO2 97%   BMI 45.83 kg/m    Intake/Output from previous day: 09/14 0701 - 09/15 0700 In: 480 [P.O.:480] Out: 2650 [Urine:2650]  Constitutional:  Alert and oriented, No acute distress. Respiratory: Normal respiratory effort, no increased work of breathing. GI: Abdomen is soft, mild incisional tenderness.  GU: voiding spontaneously   Lab Results:  Recent Labs    03/21/24 0736 03/22/24 1216  WBC  --  13.6*  HGB 11.2* 11.8*  HCT 35.0* 37.3  PLT  --  195   BMET Recent Labs    03/21/24 0736 03/22/24 1216  NA 138 139  K 4.3 4.3  CL 106 104  CO2 20* 24  GLUCOSE 112* 147*  BUN 13 11  CREATININE 1.01* 1.02*  CALCIUM  8.8* 9.1    Assessment & Plan:   S/p left robotic pyeloplasty/renal cyst decortication. Course c/b post operative fevers, culture pending. CTCAP on 9/15 with no acute abnormalities including intra-abdominal fluid collections.  - Start Zoysn, follow-up cultures.  - Encourage ambulation - Continue regular diet - Continue home medications  - Will continue hospitalization at least until AF x24 hours    Lyle LOISE Civil, MD

## 2024-03-24 ENCOUNTER — Observation Stay (HOSPITAL_COMMUNITY)

## 2024-03-24 ENCOUNTER — Other Ambulatory Visit (HOSPITAL_COMMUNITY): Payer: Self-pay

## 2024-03-24 DIAGNOSIS — E785 Hyperlipidemia, unspecified: Secondary | ICD-10-CM | POA: Diagnosis present

## 2024-03-24 DIAGNOSIS — Z87442 Personal history of urinary calculi: Secondary | ICD-10-CM | POA: Diagnosis not present

## 2024-03-24 DIAGNOSIS — Z79899 Other long term (current) drug therapy: Secondary | ICD-10-CM | POA: Diagnosis not present

## 2024-03-24 DIAGNOSIS — K760 Fatty (change of) liver, not elsewhere classified: Secondary | ICD-10-CM | POA: Diagnosis present

## 2024-03-24 DIAGNOSIS — Z981 Arthrodesis status: Secondary | ICD-10-CM | POA: Diagnosis not present

## 2024-03-24 DIAGNOSIS — N281 Cyst of kidney, acquired: Secondary | ICD-10-CM | POA: Diagnosis present

## 2024-03-24 DIAGNOSIS — E119 Type 2 diabetes mellitus without complications: Secondary | ICD-10-CM | POA: Diagnosis present

## 2024-03-24 DIAGNOSIS — J45909 Unspecified asthma, uncomplicated: Secondary | ICD-10-CM | POA: Diagnosis present

## 2024-03-24 DIAGNOSIS — R5082 Postprocedural fever: Secondary | ICD-10-CM | POA: Diagnosis not present

## 2024-03-24 DIAGNOSIS — E66813 Obesity, class 3: Secondary | ICD-10-CM | POA: Diagnosis present

## 2024-03-24 DIAGNOSIS — M79662 Pain in left lower leg: Secondary | ICD-10-CM | POA: Diagnosis not present

## 2024-03-24 DIAGNOSIS — Z803 Family history of malignant neoplasm of breast: Secondary | ICD-10-CM | POA: Diagnosis not present

## 2024-03-24 DIAGNOSIS — Z881 Allergy status to other antibiotic agents status: Secondary | ICD-10-CM | POA: Diagnosis not present

## 2024-03-24 DIAGNOSIS — R911 Solitary pulmonary nodule: Secondary | ICD-10-CM | POA: Diagnosis present

## 2024-03-24 DIAGNOSIS — J189 Pneumonia, unspecified organism: Secondary | ICD-10-CM | POA: Diagnosis not present

## 2024-03-24 DIAGNOSIS — Z6841 Body Mass Index (BMI) 40.0 and over, adult: Secondary | ICD-10-CM | POA: Diagnosis not present

## 2024-03-24 DIAGNOSIS — N135 Crossing vessel and stricture of ureter without hydronephrosis: Secondary | ICD-10-CM | POA: Diagnosis not present

## 2024-03-24 DIAGNOSIS — I10 Essential (primary) hypertension: Secondary | ICD-10-CM | POA: Diagnosis present

## 2024-03-24 DIAGNOSIS — N132 Hydronephrosis with renal and ureteral calculous obstruction: Secondary | ICD-10-CM | POA: Diagnosis present

## 2024-03-24 DIAGNOSIS — Z885 Allergy status to narcotic agent status: Secondary | ICD-10-CM | POA: Diagnosis not present

## 2024-03-24 LAB — CBC
HCT: 38.2 % (ref 36.0–46.0)
Hemoglobin: 12.2 g/dL (ref 12.0–15.0)
MCH: 29.8 pg (ref 26.0–34.0)
MCHC: 31.9 g/dL (ref 30.0–36.0)
MCV: 93.2 fL (ref 80.0–100.0)
Platelets: 226 K/uL (ref 150–400)
RBC: 4.1 MIL/uL (ref 3.87–5.11)
RDW: 12.5 % (ref 11.5–15.5)
WBC: 9.5 K/uL (ref 4.0–10.5)
nRBC: 0 % (ref 0.0–0.2)

## 2024-03-24 LAB — BASIC METABOLIC PANEL WITH GFR
Anion gap: 15 (ref 5–15)
BUN: 13 mg/dL (ref 8–23)
CO2: 20 mmol/L — ABNORMAL LOW (ref 22–32)
Calcium: 9.1 mg/dL (ref 8.9–10.3)
Chloride: 105 mmol/L (ref 98–111)
Creatinine, Ser: 1 mg/dL (ref 0.44–1.00)
GFR, Estimated: >60 mL/min (ref >60)
Glucose, Bld: 120 mg/dL — ABNORMAL HIGH (ref 70–99)
Potassium: 3.8 mmol/L (ref 3.5–5.1)
Sodium: 140 mmol/L (ref 135–145)

## 2024-03-24 MED ORDER — IOHEXOL 350 MG/ML SOLN
75.0000 mL | Freq: Once | INTRAVENOUS | Status: AC | PRN
  Administered 2024-03-24: 75 mL via INTRAVENOUS

## 2024-03-24 NOTE — Progress Notes (Signed)
 PROGRESS NOTE    Samantha Park  FMW:[id number] DOB: 05-26-1961 DOA: 03/20/2024 PCP: Aisha Harvey, MD    Brief Narrative:   Samantha Park is a 63 y.o. female with past medical history significant for HTN, HLD, asthma, nephrolithiasis, moderate left hydronephrosis secondary to left renal cyst with associated partial UPJ obstruction admit by urology for cystoscopy with pyeloplasty, decortication of left renal cyst with ureteral stent placement on 9/12.  Patient reports recovering well from her surgery, ambulating halls without difficulty.  After awakening from a nap, reports sensation of palpitations lasting approximately 15-30 seconds.  No associated shortness of breath, dizziness or chest pain.  Resolved without intervention. Hospitalist service consulted for palpitations.  Assessment & Plan:   Palpitations likely secondary to intermittent PVCs Patient reports episode of palpitations lasting 15 to 30 seconds, subsided without intervention.  Denies dizziness, no chest pain, no shortness of breath.  She reports earlier in the summer with episode of dizziness that self subsided.  Patient denies chest pain, no shortness of breath or no dizziness currently.  EKG showing sinus bradycardia with no concerning findings.  Review of telemetry earlier in hospitalization, review notable for occasional PVCs; no other arrhythmias such as PAT, atrial fibrillation/flutter, SVT noted on review.  No previous history of arrhythmia.  Telemetry has now been discontinued.  No further recommendations at this time.  If has recurrence of symptoms, recommend outpatient referral to cardiology for consideration of Holter monitor.  Essential hypertension At baseline on combination losartan -HCTZ 100-12.5 mg p.o. daily.  Postoperatively with borderline hypotension, BP this morning 125/54.  Recommend to continue to hold losartan -HCTZ on discharge unless systolic blood pressure greater 130.  Outpatient follow-up with PCP for  further guidance upon restarting  Postoperative fever Elevated D-dimer Postoperatively, patient with temperature 102.9 F.  Received Tylenol , temperature currently 99.8 F.  Reports blood in her urine with slightly increased frequency, although had recent manipulation of her GU tract secondary to decortication of left renal cyst with stent placement on 9/12.  Denies shortness of breath, no cough.  No rashes/lesions/wounds.  Urinalysis unrevealing.  WBC count 13.6 on 9/14.  CT chest/abdomen/pelvis with questionable pericholecystic fluid/gallbladder wall thickening, although no right upper quadrant tenderness or pain with oral intake.  D-dimer elevated 1.67 on 9/15. -- WBC 13.6>11.3>9.5 -- Tmax 101.4 F past 24 hours -- Urine culture with less than 10K insignificant growth -- Blood cultures: no growth x 2 days -- Vascular duplex ultrasound bilateral lower extremities: Pending (+ TTP LLE below knee) -- CT angiogram chest: Pending to rule out pulmonary embolism -- Started on doxycycline  and Zosyn  for empiric coverage -- CBC in am  HLD: -- Continue Crestor  5 mg p.o. daily  Asthma -- Continue Breztri , Singulair  --  albuterol  neb every 4 hours.  Shortness of breath  Pulmonary nodule CT chest with right upper lobe subpleural ground glass opacity measuring 1.1 cm.  Recommend repeat CT chest outpatient 42-month  Obesity, class III Body mass index is 45.83 kg/m.   DVT prophylaxis: heparin  injection 5,000 Units Start: 03/23/24 1615 SCDs Start: 03/20/24 1359    Code Status: Full Code Family Communication: Updated spouse present at bedside this morning  Disposition Plan:  Level of care: Med-Surg Status is: Observation   Subjective: Patient seen examined bedside, lying in bed.  Patient reports overall symptoms improved.  Continues with some blood in her urine with urination every 3 hours which has been not significantly changed since her surgery.  Continues with fevers overnight.   Reports pain  to her left calf.  Awaiting CT angiogram chest and vascular duplex ultrasound of bilateral lower extremities to rule out PE/DVT given elevated D-dimer yesterday.  Remains on antibiotics with doxycycline  and Zosyn . Patient with no other specific complaints, concerns or questions at this time.  Denies headache, no dizziness, no visual changes, no chest pain, no palpitations currently, no current fever, no chills/night sweats, no nausea/vomiting/diarrhea, no focal weakness, no fatigue, no paresthesias.  No other acute events overnight per nursing.  Objective: Vitals:   03/23/24 1715 03/23/24 2049 03/24/24 0508 03/24/24 0959  BP:  126/74 128/65   Pulse:  68 69   Resp:  18 18   Temp: 99.7 F (37.6 C) (!) 100.8 F (38.2 C) 98.8 F (37.1 C) 99.2 F (37.3 C)  TempSrc: Oral Oral Oral Oral  SpO2:  95% 96%   Weight:      Height:        Intake/Output Summary (Last 24 hours) at 03/24/2024 1045 Last data filed at 03/24/2024 0752 Gross per 24 hour  Intake 622.68 ml  Output 1550 ml  Net -927.32 ml   Filed Weights   03/20/24 0923 03/20/24 1400  Weight: 125.2 kg 128.8 kg    Examination:  Physical Exam: GEN: NAD, alert and oriented x 3, obese HEENT: NCAT, PERRL, EOMI, sclera clear, MMM PULM: CTAB w/o wheezes/crackles, normal respiratory effort, on room air CV: RRR w/o M/G/R GI: abd soft, NTND, + BS MSK: no peripheral edema, moves all extremities independently NEURO: No focal neurological deficit PSYCH: normal mood/affect Integumentary: No concerning rashes/lesions/wounds noted on exposed skin surfaces    Data Reviewed: I have personally reviewed following labs and imaging studies  CBC: Recent Labs  Lab 03/20/24 1225 03/21/24 0736 03/22/24 1216 03/23/24 0711 03/24/24 0451  WBC  --   --  13.6* 11.3* 9.5  NEUTROABS  --   --  9.9*  --   --   HGB 12.3 11.2* 11.8* 12.1 12.2  HCT 39.6 35.0* 37.3 38.7 38.2  MCV  --   --  94.2 93.7 93.2  PLT  --   --  195 186 226   Basic  Metabolic Panel: Recent Labs  Lab 03/21/24 0736 03/22/24 1216 03/23/24 0712 03/24/24 0451  NA 138 139 138 140  K 4.3 4.3 4.0 3.8  CL 106 104 103 105  CO2 20* 24 24 20*  GLUCOSE 112* 147* 103* 120*  BUN 13 11 13 13   CREATININE 1.01* 1.02* 0.94 1.00  CALCIUM  8.8* 9.1 9.3 9.1  MG 2.2  --   --   --    GFR: Estimated Creatinine Clearance: 79.2 mL/min (by C-G formula based on SCr of 1 mg/dL). Liver Function Tests: Recent Labs  Lab 03/23/24 0712  AST 31  ALT 25  ALKPHOS 144*  BILITOT 1.0  PROT 6.7  ALBUMIN 3.5   No results for input(s): LIPASE, AMYLASE in the last 168 hours. No results for input(s): AMMONIA in the last 168 hours. Coagulation Profile: No results for input(s): INR, PROTIME in the last 168 hours. Cardiac Enzymes: No results for input(s): CKTOTAL, CKMB, CKMBINDEX, TROPONINI in the last 168 hours. BNP (last 3 results) No results for input(s): PROBNP in the last 8760 hours. HbA1C: No results for input(s): HGBA1C in the last 72 hours. CBG: Recent Labs  Lab 03/20/24 0910 03/20/24 1229  GLUCAP 111* 132*   Lipid Profile: No results for input(s): CHOL, HDL, LDLCALC, TRIG, CHOLHDL, LDLDIRECT in the last 72 hours. Thyroid Function Tests: No results for  input(s): TSH, T4TOTAL, FREET4, T3FREE, THYROIDAB in the last 72 hours. Anemia Panel: No results for input(s): VITAMINB12, FOLATE, FERRITIN, TIBC, IRON, RETICCTPCT in the last 72 hours. Sepsis Labs: No results for input(s): PROCALCITON, LATICACIDVEN in the last 168 hours.  Recent Results (from the past 240 hours)  Urine Culture     Status: Abnormal   Collection Time: 03/22/24  1:16 PM   Specimen: Urine, Clean Catch  Result Value Ref Range Status   Specimen Description   Final    URINE, CLEAN CATCH Performed at [facility], [address] [place] [address], [place], [place] [address]    Special Requests   Final    NONE Performed at [facility], [address], [place], [place] [address]    Culture (A)  Final    <10,000 COLONIES/mL INSIGNIFICANT GROWTH Performed at [facility], [address] [place], [place] [address]    Report Status 03/23/2024 FINAL  Final  Culture, blood (Routine X 2) w Reflex to ID Panel     Status: None (Preliminary result)   Collection Time: 03/22/24  1:30 PM   Specimen: BLOOD RIGHT ARM  Result Value Ref Range Status   Specimen Description   Final    BLOOD RIGHT ARM Performed at [facility], [address], [place], [place] [address]    Special Requests   Final    BOTTLES DRAWN AEROBIC AND ANAEROBIC Blood Culture results may not be optimal due to an inadequate volume of blood received in culture bottles Performed at [facility], [address], [place], [place] [address]    Culture   Final    NO GROWTH 2 DAYS Performed at [facility], [address] [place], [place] [address]    Report Status PENDING  Incomplete  Culture, blood (Routine X 2) w Reflex to ID Panel     Status: None (Preliminary result)   Collection Time: 03/22/24  1:30 PM   Specimen: BLOOD LEFT ARM  Result Value Ref Range Status   Specimen Description   Final    BLOOD LEFT ARM Performed at [facility], [address], [place], [place] [address]    Special Requests   Final    BOTTLES DRAWN AEROBIC AND ANAEROBIC Blood Culture results may not be optimal due to an inadequate volume of blood received in culture bottles Performed at [facility], [address] [place], [place] [address]    Culture   Final    NO GROWTH 2 DAYS Performed at [facility], [address] [place], [place] [address]    Report Status PENDING  Incomplete         Radiology Studies: CT CHEST ABDOMEN PELVIS W CONTRAST Result Date: 03/22/2024 CLINICAL DATA:  Postop fever s/p robotic pyeloplasty. Cystic decortication evaluate for sources including urine leak  EXAM: CT CHEST, ABDOMEN, AND PELVIS WITH CONTRAST TECHNIQUE: Multidetector CT imaging of the chest, abdomen and pelvis was performed following the standard protocol during bolus administration of intravenous contrast. RADIATION DOSE REDUCTION: This exam was performed according to the departmental dose-optimization program which includes automated exposure control, adjustment of the mA and/or kV according to patient size and/or use of iterative reconstruction technique. CONTRAST:  OMNIPAQUE  IOHEXOL  300 MG/ML  SOLN COMPARISON:  None Available. FINDINGS: CHEST: Cardiovascular: No aortic injury. The thoracic aorta is normal in caliber. The heart is normal in size. No significant pericardial effusion. The main pulmonary artery  is normal in caliber. No central or segmental pulmonary embolus. Limited evaluation more distally due to timing of contrast. Next Mediastinum/Nodes: No pneumomediastinum. No mediastinal hematoma. The esophagus is unremarkable. The thyroid is unremarkable. The central airways are patent. No mediastinal, hilar, or axillary lymphadenopathy. Nonspecific 0.8 cm precardiac lymph node (4:231). Lungs/Pleura: Bibasilar atelectasis. Right upper lobe subpleural ground-glass airspace opacity measuring up to 1.1 cm. No focal consolidation. No pulmonary mass. No pulmonary contusion or laceration. No pneumatocele formation. No pleural effusion. No pneumothorax. No hemothorax. Musculoskeletal/Chest wall: No chest wall mass. No acute rib or sternal fracture. No spinal fracture. ABDOMEN / PELVIS: Hepatobiliary: Not enlarged. Slightly nodular hepatic contour. No focal lesion. No laceration or subcapsular hematoma. No radio-opaque gallstones within the gallbladder lumen. Question gallbladder wall thickening. Nonspecific pericholecystic fluid. No biliary ductal dilatation. Pancreas: Normal pancreatic contour. No main pancreatic duct dilatation. Spleen: Not enlarged. No focal lesion. No laceration, subcapsular  hematoma, or vascular injury. Adrenals/Urinary Tract: No nodularity bilaterally. Left ureteral stent in appropriate position. Non-enhancement of the left lower renal pole. No hydronephrosis. Interval resolution of left hydronephrosis. No contusion, laceration. Pericentimeter fluid density lesions within the bilateral kidneys likely represents simple renal cysts. Simple renal cysts, in the absence of clinically indicated signs/symptoms, require no independent follow-up. No nephroureterolithiasis. No injury to the vascular structures or collecting systems. No hydroureter. Gas within the urinary bladder lumen likely postsurgical in etiology. Otherwise the urinary bladder is unremarkable. Left calyx demonstrates cluster of multiple foci of gas as well as filling defect on delayed imaging-likely postsurgical in etiology in the setting of Surgicel. On delayed imaging, there is no extravasation of intravenous contrast on delayed view. Stomach/Bowel: No small or large bowel wall thickening or dilatation. Colonic diverticulosis. The appendix is unremarkable. Vasculature/Lymphatics: No abdominal aorta or iliac aneurysm. No active contrast extravasation or pseudoaneurysm. No abdominal, pelvic, inguinal lymphadenopathy. Reproductive: Globular appearing uterus with underlying 6.8 cm fibroid. Otherwise uterus grossly unremarkable. No adnexal mass. Other: No simple free fluid ascites. Scattered multiple foci of free gas throughout the left abdomen likely postsurgical in etiology. No hemoperitoneum. No mesenteric hematoma identified. No organized fluid collection. Musculoskeletal: No significant soft tissue hematoma. Subcutaneus soft tissue emphysema along the left chest wall, abdominal wall, leg likely postsurgical in etiology. No acute pelvic fracture. No spinal fracture. Other ports and devices: None. IMPRESSION: 1. No acute intrathoracic abnormality. 2. Non-enhancement of the left lower renal pole which may be due to infarction  or developing hematoma. No active extravasation identified. Superimposed infection not excluded. 3. Left ureteral stent in appropriate position. 4. Postsurgical changes of the abdomen noted. Left calyx demonstrates cluster of multiple foci of gas as well as filling defect on delayed imaging-likely postsurgical in etiology in the setting of Surgicel 5. Subcutaneus soft tissue emphysema - postsurgical in etiology status post robotic surgery. Other imaging findings of potential clinical significance: 1. Question pericholecystic fluid and gallbladder wall thickening. Consider right upper quadrant ultrasound for further evaluation. 2. Right upper lobe subpleural ground-glass airspace opacity measuring up to 1.1 cm. Initial follow-up with CT at 6 months is recommended to confirm persistence. If persistent, repeat CT is recommended every 2 years until 5 years of stability has been established. This recommendation follows the consensus statement: Guidelines for Management of Incidental Pulmonary Nodules Detected on CT Images: From the Fleischner Society 2017; Radiology 2017; 284:228-243. 3. Cirrhosis. No focal liver lesions identified. Please note that liver protocol enhanced MR and CT are the most sensitive tests for the screening detection of hepatocellular carcinoma  in the high risk setting of cirrhosis. 4. Colonic diverticulosis with no acute diverticulitis. 5. Uterine fibroid. 6.  Aortic Atherosclerosis (ICD10-I70.0). These results will be called to the ordering clinician or representative by the Radiologist Assistant, and communication documented in the PACS or [employer]. Electronically Signed   By: Morgane  Naveau M.D.   On: 03/22/2024 19:46        Scheduled Meds:  budesonide -glycopyrrolate -formoterol   2 puff Inhalation BID   docusate sodium   100 mg Oral BID   doxycycline   100 mg Oral Q12H   heparin  injection (subcutaneous)  5,000 Units Subcutaneous Q8H   hydrOXYzine   25 mg Oral BID   montelukast    10 mg Oral q morning   rosuvastatin   5 mg Oral QPM   ursodiol   300 mg Oral BID   Continuous Infusions:  piperacillin -tazobactam (ZOSYN )  IV 3.375 g (03/24/24 0932)     LOS: 0 days    Time spent: 45 minutes spent on 03/24/2024 caring for this patient face-to-face including chart review, ordering labs/tests, documenting, discussion with nursing staff, consultants, updating family and interview/physical exam    Camellia PARAS [place], DO [facility] Available via Epic secure chat 7am-7pm After these hours, please refer to coverage provider listed on [email or web address] 03/24/2024, 10:45 AM

## 2024-03-24 NOTE — Progress Notes (Signed)
   POD #4 s/p cyst decortication/pyeloplasty c/b postoperative fevers of unknown source. CTAP from 9/15 with no clear intra-abdominal source. Currently afebrile with last fever on 9/15 at 3PM.  Interval Events Remains afebrile. Pain relatively controlled. Voiding spontaneously. Drain site dry. Abdomen soft.  Labs normalizing. Cultures negative. On Doxy and Zosyn .  Does have significant tenderness of palpation of LLE. No SOB, VSS.   Physical Exam: BP 128/65 (BP Location: Left Arm)   Pulse 69   Temp 98.8 F (37.1 C) (Oral)   Resp 18   Ht 5' 6 (1.676 m)   Wt 128.8 kg   SpO2 96%   BMI 45.83 kg/m    Intake/Output from previous day: 09/15 0701 - 09/16 0700 In: 872.7 [P.O.:730; IV Piggyback:142.7] Out: 1350 [Urine:1350]  Constitutional:  Alert and oriented, No acute distress. Respiratory: Normal respiratory effort, no increased work of breathing. GI: Abdomen is soft, mild incisional tenderness.  GU: voiding spontaneously  Ext: TTP of LLE. No tenderness on RLE. Extremities appear symmetric in size.   Lab Results:  Recent Labs    03/23/24 0711 03/24/24 0451  WBC 11.3* 9.5  HGB 12.1 12.2  HCT 38.7 38.2  PLT 186 226   BMET Recent Labs    03/23/24 0712 03/24/24 0451  NA 138 140  K 4.0 3.8  CL 103 105  CO2 24 20*  GLUCOSE 103* 120*  BUN 13 13  CREATININE 0.94 1.00  CALCIUM  9.3 9.1    Assessment & Plan:   S/p left robotic pyeloplasty/renal cyst decortication. Course c/b post operative fevers, culture pending. CTCAP on 9/15 with no acute abnormalities including intra-abdominal fluid collections.  - Culture negative. On Doxy and Zoysn  - Encourage ambulation - SQH - Continue regular diet - Continue home medications  - LLE dopplers pending  Lyle LOISE Civil, MD

## 2024-03-25 ENCOUNTER — Other Ambulatory Visit (HOSPITAL_COMMUNITY): Payer: Self-pay

## 2024-03-25 MED ORDER — DOXYCYCLINE HYCLATE 100 MG PO TABS
100.0000 mg | ORAL_TABLET | Freq: Two times a day (BID) | ORAL | 0 refills | Status: AC
Start: 2024-03-25 — End: 2024-03-30
  Filled 2024-03-25: qty 10, 5d supply, fill #0

## 2024-03-25 MED ORDER — AMOXICILLIN-POT CLAVULANATE 875-125 MG PO TABS
1.0000 | ORAL_TABLET | Freq: Two times a day (BID) | ORAL | 0 refills | Status: AC
Start: 2024-03-25 — End: 2024-03-30
  Filled 2024-03-25: qty 10, 5d supply, fill #0

## 2024-03-25 NOTE — Plan of Care (Signed)

## 2024-03-25 NOTE — Progress Notes (Signed)
 Discharge instructions given to patient and husband. Questions asked and answered. Discharge medications delivered to patient at bedside in a secure bag.

## 2024-03-25 NOTE — Hospital Course (Signed)
 Samantha Park is a 63 y.o. female with past medical history significant of hypertension, hyperlipidemia, asthma, left hydronephrosis secondary to left renal cyst with partial UPJ obstruction was admitted by urology for  cystoscopy with pyeloplasty, decortication of left renal cyst with ureteral stent placement on 9/12.  Medical team was consulted due to palpitations without any shortness of breath or dyspnea.     Assessment & Plan:   Palpitations likely secondary to intermittent PVCs Patient reports episode of palpitations lasting 15 to 30 seconds, subsided without intervention.  Patient reported earlier in the summer with episode of dizziness that self subsided.  No chest pain and EKG showed sinus bradycardia with occasional PVCs.  No further recommendations at this time.  If has recurrence of symptoms, recommend outpatient referral to cardiology for consideration of Holter monitor.   Essential hypertension Recommend to continue to hold losartan -HCTZ on discharge unless systolic blood pressure greater 130.  Outpatient follow-up with PCP for further guidance upon restarting   Postoperative fever could be secondary to atelectasis. Elevated D-dimer Temperature max of 100.1 F.  R some frequency with hematuria but had  GU tract intervention on 03/20/2024.    CT chest/abdomen/pelvis with questionable pericholecystic fluid/gallbladder wall thickening, although no right upper quadrant tenderness or pain with oral intake.  D-dimer elevated 1.67 on 9/15.Samantha Park  Leukocytosis has trended down and has normalized.  Urine culture with less than 10K insignificant growth.  Blood cultures negative in 2 days.  Duplex ultrasound of the lower extremities negative for DVT.  Blood cultures negative in 3 days.-- Vascular duplex ultrasound bilateral lower extremities: CT angiogram of the chest was negative for pulmonary embolism or acute process but some bibasilar atelectasis. Patient was started empirically on doxycycline  and  Zosyn  for empiric coverage.    Hyperlipidemia Continue Crestor    Asthma Continue Breztri , Singulair , albuterol    Pulmonary nodule CT chest with right upper lobe subpleural ground glass opacity measuring 1.1 cm. Recommend repeat CT chest outpatient 52-month   Obesity, class III Body mass index is 45.83 kg/m.  Would benefit from ongoing weight loss as outpatient.

## 2024-03-25 NOTE — Discharge Summary (Signed)
 Date of admission: 03/20/2024  Date of discharge: 03/25/2024  Admission diagnosis: UPJ obstruction and cyst  Discharge diagnosis: Same  Secondary diagnoses:  - Right lower extremity pain, negative for DVT - Post operative fevers, unknown source - Continue antibiotics for 7 day total source.  - Palpitations - No further follow-up at this point unless symptoms recur. - Borderline hypotension - Hold losartan -hydrochlorothiazide  on discharge  - RUL subpleural ground glass opacity measuring 1.1 cm - Repeat CT chest outpatient 39-month  History and Physical: For full details, please see admission history and physical. Briefly, Samantha Park is a 63 y.o. year old patient with UPJ obstruction in setting of large cyst compressing the ureter.   Hospital Course:  89F with history significant for HTN, HLD, asthma, nephrolithiasis, moderate left hydronephrosis secondary to left renal cyst with associated partial UPJ obstruction who underwent decortication of left renal cyst with ureteral stent placement on 9/12.   On POD1, she reported palpitations and medicine was consulted. At this time, EKG showed sinus bradycardia with no concerning findings.  Review of telemetry earlier in hospitalization was notable for occasional PVCs; no other arrhythmias such as PAT, atrial fibrillation/flutter, SVT noted on review.  No previous history of arrhythmia. No specific outpatient follow-up was recommended at this time. Referral to cardiology could be considered if symptoms recur outpatient.   On POD2, the patient began to have temperatures up to 102.9. Cultures were all negative. Ultimately, a CTCAP was obtained with no acute intra-abdominal findings. However, a there was noted to be a right upper lobe subpleural ground glass opacity measuring 1.1 cm. Medicine recommended repeat CT chest outpatient at 6 months.   Given persistent fevers on POD-4 as well as new RLE tenderness to palpation, a workup for DVT and PE was  pursued. This was reassuringly negative.   Ultimately, the patient was deemed appropriate for discharge on POD5. At this point, she was afebrile, tolerating regular diet, and voiding spontaneously. She was discharged with a total of 7 day course of antibiotics with Doxycycline  and Augmentin  given post operative fevers and possible pneumonia.   Laboratory values:  Recent Labs    03/22/24 1216 03/23/24 0711 03/24/24 0451  HGB 11.8* 12.1 12.2  HCT 37.3 38.7 38.2   Recent Labs    03/23/24 0712 03/24/24 0451  CREATININE 0.94 1.00    Disposition: Home  Discharge instruction: The patient was instructed to be ambulatory but told to refrain from heavy lifting, strenuous activity, or driving.   Other Recommendations by Medicine Team:  #Palpitations - If has recurrence of symptoms, recommend outpatient referral to cardiology for consideration of Holter monitor. #Hypertension - Given borderline hypotension, recommend to continue to hold losartan -HCTZ on discharge unless systolic blood pressure greater 130. Outpatient follow-up with PCP for further guidance upon restarting.  #Pulmonary Nodule - Recommend repeat CT chest outpatient 63-month   Otherwise, home medications resumed as normal.   Discharge medications:  Allergies as of 03/25/2024       Reactions   Phentermine Anxiety   Other Reaction(s): anxiety, aggitation   Codeine Rash   Severe rash instantly - mainly Tylenol  #3   Cyclobenzaprine Other (See Comments)   Will be sleeping for 10 hours    Erythromycin Nausea Only   Hydromorphone  Other (See Comments)   Will be sleeping for 10 hours    Ketek [telithromycin] Nausea And Vomiting   Largon [propiomazine] Other (See Comments)   Dizziness   Meloxicam Itching, Rash   Methocarbamol Other (See Comments)  Knocks me out for 10 hours   Stadol [butorphanol] Nausea And Vomiting   Tramadol  Other (See Comments)   Looked and acted like I was in a drunk stupor after taking it         Medication List     STOP taking these medications    losartan -hydrochlorothiazide  100-12.5 MG tablet Commonly known as: HYZAAR   Vitamin D3 50 MCG (2000 UT) Tabs       TAKE these medications    amoxicillin -clavulanate 875-125 MG tablet Commonly known as: AUGMENTIN  Take 1 tablet by mouth 2 (two) times daily for 5 days.   Breztri  Aerosphere 160-9-4.8 MCG/ACT Aero inhaler Generic drug: budesonide -glycopyrrolate -formoterol  Inhale 2 puffs into the lungs in the morning and at bedtime.   cetirizine 10 MG tablet Commonly known as: ZYRTEC Take 10 mg by mouth daily.   docusate sodium  100 MG capsule Commonly known as: COLACE Take 1 capsule (100 mg total) by mouth 2 (two) times daily.   doxycycline  100 MG tablet Commonly known as: VIBRA -TABS Take 1 tablet (100 mg total) by mouth 2 (two) times daily for 5 days.   estradiol 0.1 MG/GM vaginal cream Commonly known as: ESTRACE Place 2 g vaginally daily as needed (vaginal itching).   HYDROcodone -acetaminophen  5-325 MG tablet Commonly known as: NORCO/VICODIN Take 1-2 tablets by mouth every 6 (six) hours as needed for moderate pain (pain score 4-6) or severe pain (pain score 7-10).   hydrOXYzine  25 MG tablet Commonly known as: ATARAX  Take 25 mg by mouth in the morning and at bedtime.   ketoconazole 2 % cream Commonly known as: NIZORAL Apply 1 Application topically daily.   montelukast  10 MG tablet Commonly known as: SINGULAIR  Take 10 mg by mouth every morning.   rosuvastatin  5 MG tablet Commonly known as: CRESTOR  Take 5 mg by mouth every evening.   Systane 0.4-0.3 % Gel ophthalmic gel Generic drug: Polyethyl Glycol-Propyl Glycol Place 1 Application into both eyes 3 (three) times daily as needed (dry eyes).   tiZANidine  4 MG capsule Commonly known as: ZANAFLEX  Take 4 mg by mouth 3 (three) times daily as needed for muscle spasms.   triamcinolone  cream 0.1 % Commonly known as: KENALOG  Apply 1 Application topically  daily at 12 noon.   ursodiol  500 MG tablet Commonly known as: ACTIGALL  Take 500 mg by mouth 2 (two) times daily.   Ventolin  HFA 108 (90 Base) MCG/ACT inhaler Generic drug: albuterol  Inhale 1-2 puffs into the lungs every 4 (four) hours as needed for shortness of breath.        Followup:   Follow-up Information     Alvaro Ricardo KATHEE Raddle., MD Follow up on 04/07/2024.   Specialty: Urology Why: at 9 AM for MD visit and likely stent removal. Contact information: [address] [place] [address] [phone]

## 2024-03-26 ENCOUNTER — Other Ambulatory Visit (HOSPITAL_COMMUNITY): Payer: Self-pay

## 2024-03-27 LAB — CULTURE, BLOOD (ROUTINE X 2)
Culture: NO GROWTH
Culture: NO GROWTH

## 2024-04-07 ENCOUNTER — Other Ambulatory Visit: Payer: Self-pay | Admitting: Family Medicine

## 2024-04-07 DIAGNOSIS — R911 Solitary pulmonary nodule: Secondary | ICD-10-CM

## 2024-04-23 ENCOUNTER — Ambulatory Visit (HOSPITAL_COMMUNITY)
Admission: RE | Admit: 2024-04-23 | Discharge: 2024-04-23 | Disposition: A | Discharge status: Home / Self Care | Source: Ambulatory Visit | Attending: Vascular Surgery | Admitting: Vascular Surgery

## 2024-04-23 ENCOUNTER — Ambulatory Visit

## 2024-04-23 ENCOUNTER — Other Ambulatory Visit (HOSPITAL_COMMUNITY): Payer: Self-pay | Admitting: Family Medicine

## 2024-04-23 DIAGNOSIS — I808 Phlebitis and thrombophlebitis of other sites: Secondary | ICD-10-CM | POA: Insufficient documentation

## 2024-04-24 ENCOUNTER — Other Ambulatory Visit: Payer: Self-pay | Admitting: Gastroenterology

## 2024-04-24 ENCOUNTER — Ambulatory Visit (HOSPITAL_COMMUNITY)

## 2024-04-24 DIAGNOSIS — K743 Primary biliary cirrhosis: Secondary | ICD-10-CM

## 2024-04-29 ENCOUNTER — Ambulatory Visit
Admission: RE | Admit: 2024-04-29 | Discharge: 2024-04-29 | Disposition: A | Discharge status: Home / Self Care | Source: Ambulatory Visit | Attending: Gastroenterology | Admitting: Gastroenterology

## 2024-04-29 DIAGNOSIS — K743 Primary biliary cirrhosis: Secondary | ICD-10-CM

## 2024-06-08 ENCOUNTER — Other Ambulatory Visit (HOSPITAL_COMMUNITY): Payer: Self-pay

## 2024-08-07 ENCOUNTER — Other Ambulatory Visit (HOSPITAL_COMMUNITY): Payer: Self-pay

## 2024-08-07 ENCOUNTER — Encounter: Admitting: Dietician

## 2024-09-08 ENCOUNTER — Other Ambulatory Visit

## 2024-09-10 ENCOUNTER — Encounter: Admitting: Dietician
